# Patient Record
Sex: Male | Born: 1935 | Race: White | Hispanic: No | Marital: Married
Health system: Southern US, Community
[De-identification: ages and names within clinical notes are randomized; demographics above are authoritative.]

## PROBLEM LIST (undated history)

## (undated) DIAGNOSIS — I509 Heart failure, unspecified: Secondary | ICD-10-CM

## (undated) DIAGNOSIS — I10 Essential (primary) hypertension: Secondary | ICD-10-CM

## (undated) DIAGNOSIS — F0390 Unspecified dementia without behavioral disturbance: Secondary | ICD-10-CM

---

## 2000-07-08 ENCOUNTER — Inpatient Hospital Stay (HOSPITAL_COMMUNITY)
Admission: RE | Admit: 2000-07-08 | Discharge: 2000-07-17 | Payer: Self-pay | Admitting: Physical Medicine and Rehabilitation

## 2000-07-11 ENCOUNTER — Encounter: Payer: Self-pay | Admitting: Physical Medicine and Rehabilitation

## 2015-12-05 DIAGNOSIS — E8881 Metabolic syndrome: Secondary | ICD-10-CM | POA: Diagnosis not present

## 2015-12-05 DIAGNOSIS — I5032 Chronic diastolic (congestive) heart failure: Secondary | ICD-10-CM | POA: Diagnosis not present

## 2015-12-05 DIAGNOSIS — N183 Chronic kidney disease, stage 3 (moderate): Secondary | ICD-10-CM | POA: Diagnosis not present

## 2015-12-05 DIAGNOSIS — I110 Hypertensive heart disease with heart failure: Secondary | ICD-10-CM | POA: Diagnosis not present

## 2016-05-29 DIAGNOSIS — H2513 Age-related nuclear cataract, bilateral: Secondary | ICD-10-CM | POA: Diagnosis not present

## 2016-06-07 DIAGNOSIS — Z79899 Other long term (current) drug therapy: Secondary | ICD-10-CM | POA: Diagnosis not present

## 2016-06-07 DIAGNOSIS — D638 Anemia in other chronic diseases classified elsewhere: Secondary | ICD-10-CM | POA: Diagnosis not present

## 2016-06-07 DIAGNOSIS — N184 Chronic kidney disease, stage 4 (severe): Secondary | ICD-10-CM | POA: Diagnosis not present

## 2016-06-07 DIAGNOSIS — I5032 Chronic diastolic (congestive) heart failure: Secondary | ICD-10-CM | POA: Diagnosis not present

## 2016-06-07 DIAGNOSIS — I110 Hypertensive heart disease with heart failure: Secondary | ICD-10-CM | POA: Diagnosis not present

## 2016-07-09 DIAGNOSIS — H34832 Tributary (branch) retinal vein occlusion, left eye, with macular edema: Secondary | ICD-10-CM | POA: Diagnosis not present

## 2016-07-09 DIAGNOSIS — H40013 Open angle with borderline findings, low risk, bilateral: Secondary | ICD-10-CM | POA: Diagnosis not present

## 2016-07-09 DIAGNOSIS — H2511 Age-related nuclear cataract, right eye: Secondary | ICD-10-CM | POA: Diagnosis not present

## 2016-07-09 DIAGNOSIS — H348392 Tributary (branch) retinal vein occlusion, unspecified eye, stable: Secondary | ICD-10-CM | POA: Diagnosis not present

## 2016-07-10 DIAGNOSIS — N184 Chronic kidney disease, stage 4 (severe): Secondary | ICD-10-CM | POA: Diagnosis not present

## 2016-07-10 DIAGNOSIS — I160 Hypertensive urgency: Secondary | ICD-10-CM | POA: Diagnosis not present

## 2016-07-10 DIAGNOSIS — Z2821 Immunization not carried out because of patient refusal: Secondary | ICD-10-CM | POA: Diagnosis not present

## 2016-07-10 DIAGNOSIS — I110 Hypertensive heart disease with heart failure: Secondary | ICD-10-CM | POA: Diagnosis not present

## 2016-07-24 DIAGNOSIS — I110 Hypertensive heart disease with heart failure: Secondary | ICD-10-CM | POA: Diagnosis not present

## 2016-07-24 DIAGNOSIS — N401 Enlarged prostate with lower urinary tract symptoms: Secondary | ICD-10-CM | POA: Diagnosis not present

## 2016-07-24 DIAGNOSIS — N184 Chronic kidney disease, stage 4 (severe): Secondary | ICD-10-CM | POA: Diagnosis not present

## 2016-07-30 DIAGNOSIS — Z79899 Other long term (current) drug therapy: Secondary | ICD-10-CM | POA: Diagnosis not present

## 2016-07-30 DIAGNOSIS — G4733 Obstructive sleep apnea (adult) (pediatric): Secondary | ICD-10-CM | POA: Diagnosis not present

## 2016-07-30 DIAGNOSIS — I110 Hypertensive heart disease with heart failure: Secondary | ICD-10-CM | POA: Diagnosis not present

## 2016-07-30 DIAGNOSIS — H2511 Age-related nuclear cataract, right eye: Secondary | ICD-10-CM | POA: Diagnosis not present

## 2016-07-30 DIAGNOSIS — Z87891 Personal history of nicotine dependence: Secondary | ICD-10-CM | POA: Diagnosis not present

## 2016-07-30 DIAGNOSIS — Z7982 Long term (current) use of aspirin: Secondary | ICD-10-CM | POA: Diagnosis not present

## 2016-07-30 DIAGNOSIS — K219 Gastro-esophageal reflux disease without esophagitis: Secondary | ICD-10-CM | POA: Diagnosis not present

## 2016-07-30 DIAGNOSIS — I509 Heart failure, unspecified: Secondary | ICD-10-CM | POA: Diagnosis not present

## 2016-09-07 DIAGNOSIS — I10 Essential (primary) hypertension: Secondary | ICD-10-CM | POA: Diagnosis not present

## 2016-09-12 DIAGNOSIS — N184 Chronic kidney disease, stage 4 (severe): Secondary | ICD-10-CM | POA: Diagnosis not present

## 2016-09-12 DIAGNOSIS — I110 Hypertensive heart disease with heart failure: Secondary | ICD-10-CM | POA: Diagnosis not present

## 2017-02-25 DIAGNOSIS — H26491 Other secondary cataract, right eye: Secondary | ICD-10-CM | POA: Diagnosis not present

## 2017-02-25 DIAGNOSIS — H02836 Dermatochalasis of left eye, unspecified eyelid: Secondary | ICD-10-CM | POA: Diagnosis not present

## 2017-02-25 DIAGNOSIS — H02833 Dermatochalasis of right eye, unspecified eyelid: Secondary | ICD-10-CM | POA: Diagnosis not present

## 2017-02-25 DIAGNOSIS — H2512 Age-related nuclear cataract, left eye: Secondary | ICD-10-CM | POA: Diagnosis not present

## 2017-03-04 DIAGNOSIS — I110 Hypertensive heart disease with heart failure: Secondary | ICD-10-CM | POA: Diagnosis not present

## 2017-03-04 DIAGNOSIS — I5032 Chronic diastolic (congestive) heart failure: Secondary | ICD-10-CM | POA: Diagnosis not present

## 2017-03-04 DIAGNOSIS — I5030 Unspecified diastolic (congestive) heart failure: Secondary | ICD-10-CM | POA: Diagnosis not present

## 2017-03-04 DIAGNOSIS — N184 Chronic kidney disease, stage 4 (severe): Secondary | ICD-10-CM | POA: Diagnosis not present

## 2017-06-05 DIAGNOSIS — M10072 Idiopathic gout, left ankle and foot: Secondary | ICD-10-CM | POA: Diagnosis not present

## 2017-06-05 DIAGNOSIS — M109 Gout, unspecified: Secondary | ICD-10-CM | POA: Diagnosis not present

## 2017-06-06 DIAGNOSIS — I129 Hypertensive chronic kidney disease with stage 1 through stage 4 chronic kidney disease, or unspecified chronic kidney disease: Secondary | ICD-10-CM | POA: Diagnosis not present

## 2017-06-06 DIAGNOSIS — N189 Chronic kidney disease, unspecified: Secondary | ICD-10-CM | POA: Diagnosis not present

## 2017-07-03 DIAGNOSIS — E559 Vitamin D deficiency, unspecified: Secondary | ICD-10-CM | POA: Diagnosis not present

## 2017-07-03 DIAGNOSIS — Z1389 Encounter for screening for other disorder: Secondary | ICD-10-CM | POA: Diagnosis not present

## 2017-07-03 DIAGNOSIS — Z79899 Other long term (current) drug therapy: Secondary | ICD-10-CM | POA: Diagnosis not present

## 2017-07-03 DIAGNOSIS — Z0000 Encounter for general adult medical examination without abnormal findings: Secondary | ICD-10-CM | POA: Diagnosis not present

## 2017-07-03 DIAGNOSIS — Z9181 History of falling: Secondary | ICD-10-CM | POA: Diagnosis not present

## 2017-07-03 DIAGNOSIS — N184 Chronic kidney disease, stage 4 (severe): Secondary | ICD-10-CM | POA: Diagnosis not present

## 2018-02-12 DIAGNOSIS — E559 Vitamin D deficiency, unspecified: Secondary | ICD-10-CM | POA: Diagnosis not present

## 2018-02-12 DIAGNOSIS — I10 Essential (primary) hypertension: Secondary | ICD-10-CM | POA: Diagnosis not present

## 2018-02-12 DIAGNOSIS — Z79899 Other long term (current) drug therapy: Secondary | ICD-10-CM | POA: Diagnosis not present

## 2018-02-12 DIAGNOSIS — D638 Anemia in other chronic diseases classified elsewhere: Secondary | ICD-10-CM | POA: Diagnosis not present

## 2018-02-12 DIAGNOSIS — I5032 Chronic diastolic (congestive) heart failure: Secondary | ICD-10-CM | POA: Diagnosis not present

## 2018-03-13 DIAGNOSIS — I10 Essential (primary) hypertension: Secondary | ICD-10-CM | POA: Diagnosis not present

## 2018-03-13 DIAGNOSIS — I5032 Chronic diastolic (congestive) heart failure: Secondary | ICD-10-CM | POA: Diagnosis not present

## 2018-03-13 DIAGNOSIS — N184 Chronic kidney disease, stage 4 (severe): Secondary | ICD-10-CM | POA: Diagnosis not present

## 2018-04-05 DIAGNOSIS — R609 Edema, unspecified: Secondary | ICD-10-CM | POA: Diagnosis not present

## 2018-04-05 DIAGNOSIS — R0602 Shortness of breath: Secondary | ICD-10-CM | POA: Diagnosis not present

## 2018-04-05 DIAGNOSIS — I4510 Unspecified right bundle-branch block: Secondary | ICD-10-CM | POA: Diagnosis not present

## 2018-04-05 DIAGNOSIS — I440 Atrioventricular block, first degree: Secondary | ICD-10-CM | POA: Diagnosis not present

## 2018-04-06 DIAGNOSIS — I509 Heart failure, unspecified: Secondary | ICD-10-CM | POA: Diagnosis not present

## 2018-04-06 DIAGNOSIS — Z66 Do not resuscitate: Secondary | ICD-10-CM | POA: Diagnosis not present

## 2018-04-06 DIAGNOSIS — I10 Essential (primary) hypertension: Secondary | ICD-10-CM | POA: Diagnosis not present

## 2018-04-06 DIAGNOSIS — Z87891 Personal history of nicotine dependence: Secondary | ICD-10-CM | POA: Diagnosis not present

## 2018-04-06 DIAGNOSIS — I2510 Atherosclerotic heart disease of native coronary artery without angina pectoris: Secondary | ICD-10-CM | POA: Diagnosis not present

## 2018-04-06 DIAGNOSIS — N202 Calculus of kidney with calculus of ureter: Secondary | ICD-10-CM | POA: Diagnosis not present

## 2018-04-06 DIAGNOSIS — I5033 Acute on chronic diastolic (congestive) heart failure: Secondary | ICD-10-CM | POA: Diagnosis not present

## 2018-04-06 DIAGNOSIS — J9601 Acute respiratory failure with hypoxia: Secondary | ICD-10-CM | POA: Diagnosis not present

## 2018-04-06 DIAGNOSIS — I130 Hypertensive heart and chronic kidney disease with heart failure and stage 1 through stage 4 chronic kidney disease, or unspecified chronic kidney disease: Secondary | ICD-10-CM | POA: Diagnosis not present

## 2018-04-06 DIAGNOSIS — R001 Bradycardia, unspecified: Secondary | ICD-10-CM | POA: Diagnosis not present

## 2018-04-06 DIAGNOSIS — N184 Chronic kidney disease, stage 4 (severe): Secondary | ICD-10-CM | POA: Diagnosis not present

## 2018-04-06 DIAGNOSIS — E875 Hyperkalemia: Secondary | ICD-10-CM | POA: Diagnosis not present

## 2018-04-06 DIAGNOSIS — I110 Hypertensive heart disease with heart failure: Secondary | ICD-10-CM | POA: Diagnosis not present

## 2018-04-06 DIAGNOSIS — R0602 Shortness of breath: Secondary | ICD-10-CM

## 2018-04-06 DIAGNOSIS — I340 Nonrheumatic mitral (valve) insufficiency: Secondary | ICD-10-CM | POA: Diagnosis not present

## 2018-04-06 DIAGNOSIS — I351 Nonrheumatic aortic (valve) insufficiency: Secondary | ICD-10-CM | POA: Diagnosis not present

## 2018-04-06 DIAGNOSIS — Z2821 Immunization not carried out because of patient refusal: Secondary | ICD-10-CM | POA: Diagnosis not present

## 2018-04-06 DIAGNOSIS — N139 Obstructive and reflux uropathy, unspecified: Secondary | ICD-10-CM | POA: Diagnosis not present

## 2018-04-06 DIAGNOSIS — N189 Chronic kidney disease, unspecified: Secondary | ICD-10-CM | POA: Diagnosis not present

## 2018-04-06 DIAGNOSIS — R079 Chest pain, unspecified: Secondary | ICD-10-CM | POA: Diagnosis not present

## 2018-04-06 DIAGNOSIS — N179 Acute kidney failure, unspecified: Secondary | ICD-10-CM | POA: Diagnosis not present

## 2018-04-06 DIAGNOSIS — N1330 Unspecified hydronephrosis: Secondary | ICD-10-CM | POA: Diagnosis not present

## 2018-04-06 DIAGNOSIS — I161 Hypertensive emergency: Secondary | ICD-10-CM | POA: Diagnosis not present

## 2018-04-06 DIAGNOSIS — I441 Atrioventricular block, second degree: Secondary | ICD-10-CM | POA: Diagnosis not present

## 2018-04-06 DIAGNOSIS — N131 Hydronephrosis with ureteral stricture, not elsewhere classified: Secondary | ICD-10-CM | POA: Diagnosis not present

## 2018-04-07 DIAGNOSIS — N1330 Unspecified hydronephrosis: Secondary | ICD-10-CM | POA: Diagnosis not present

## 2018-04-09 DIAGNOSIS — R0602 Shortness of breath: Secondary | ICD-10-CM

## 2018-04-14 DIAGNOSIS — I441 Atrioventricular block, second degree: Secondary | ICD-10-CM | POA: Diagnosis not present

## 2018-04-14 DIAGNOSIS — I509 Heart failure, unspecified: Secondary | ICD-10-CM | POA: Diagnosis not present

## 2018-04-14 DIAGNOSIS — N139 Obstructive and reflux uropathy, unspecified: Secondary | ICD-10-CM | POA: Diagnosis not present

## 2018-04-14 DIAGNOSIS — I130 Hypertensive heart and chronic kidney disease with heart failure and stage 1 through stage 4 chronic kidney disease, or unspecified chronic kidney disease: Secondary | ICD-10-CM | POA: Diagnosis not present

## 2018-04-14 DIAGNOSIS — Z7902 Long term (current) use of antithrombotics/antiplatelets: Secondary | ICD-10-CM | POA: Diagnosis not present

## 2018-04-14 DIAGNOSIS — N184 Chronic kidney disease, stage 4 (severe): Secondary | ICD-10-CM | POA: Diagnosis not present

## 2018-04-14 DIAGNOSIS — Z436 Encounter for attention to other artificial openings of urinary tract: Secondary | ICD-10-CM | POA: Diagnosis not present

## 2018-04-14 DIAGNOSIS — M1991 Primary osteoarthritis, unspecified site: Secondary | ICD-10-CM | POA: Diagnosis not present

## 2018-04-14 DIAGNOSIS — Z7982 Long term (current) use of aspirin: Secondary | ICD-10-CM | POA: Diagnosis not present

## 2018-04-14 DIAGNOSIS — N400 Enlarged prostate without lower urinary tract symptoms: Secondary | ICD-10-CM | POA: Diagnosis not present

## 2018-04-14 DIAGNOSIS — N1330 Unspecified hydronephrosis: Secondary | ICD-10-CM | POA: Diagnosis not present

## 2018-04-16 DIAGNOSIS — N1330 Unspecified hydronephrosis: Secondary | ICD-10-CM | POA: Diagnosis not present

## 2018-04-16 DIAGNOSIS — N135 Crossing vessel and stricture of ureter without hydronephrosis: Secondary | ICD-10-CM | POA: Diagnosis not present

## 2018-04-17 DIAGNOSIS — Z7982 Long term (current) use of aspirin: Secondary | ICD-10-CM | POA: Diagnosis not present

## 2018-04-17 DIAGNOSIS — Z7902 Long term (current) use of antithrombotics/antiplatelets: Secondary | ICD-10-CM | POA: Diagnosis not present

## 2018-04-17 DIAGNOSIS — Z436 Encounter for attention to other artificial openings of urinary tract: Secondary | ICD-10-CM | POA: Diagnosis not present

## 2018-04-17 DIAGNOSIS — N1330 Unspecified hydronephrosis: Secondary | ICD-10-CM | POA: Diagnosis not present

## 2018-04-17 DIAGNOSIS — I509 Heart failure, unspecified: Secondary | ICD-10-CM | POA: Diagnosis not present

## 2018-04-17 DIAGNOSIS — I130 Hypertensive heart and chronic kidney disease with heart failure and stage 1 through stage 4 chronic kidney disease, or unspecified chronic kidney disease: Secondary | ICD-10-CM | POA: Diagnosis not present

## 2018-04-17 DIAGNOSIS — N184 Chronic kidney disease, stage 4 (severe): Secondary | ICD-10-CM | POA: Diagnosis not present

## 2018-04-17 DIAGNOSIS — N139 Obstructive and reflux uropathy, unspecified: Secondary | ICD-10-CM | POA: Diagnosis not present

## 2018-04-17 DIAGNOSIS — M1991 Primary osteoarthritis, unspecified site: Secondary | ICD-10-CM | POA: Diagnosis not present

## 2018-04-17 DIAGNOSIS — N400 Enlarged prostate without lower urinary tract symptoms: Secondary | ICD-10-CM | POA: Diagnosis not present

## 2018-04-17 DIAGNOSIS — I441 Atrioventricular block, second degree: Secondary | ICD-10-CM | POA: Diagnosis not present

## 2018-04-20 DIAGNOSIS — N1330 Unspecified hydronephrosis: Secondary | ICD-10-CM | POA: Diagnosis not present

## 2018-04-21 DIAGNOSIS — I10 Essential (primary) hypertension: Secondary | ICD-10-CM | POA: Diagnosis not present

## 2018-04-21 DIAGNOSIS — J449 Chronic obstructive pulmonary disease, unspecified: Secondary | ICD-10-CM | POA: Diagnosis not present

## 2018-04-21 DIAGNOSIS — R279 Unspecified lack of coordination: Secondary | ICD-10-CM | POA: Diagnosis not present

## 2018-04-21 DIAGNOSIS — Z7401 Bed confinement status: Secondary | ICD-10-CM | POA: Diagnosis not present

## 2018-04-21 DIAGNOSIS — Q6211 Congenital occlusion of ureteropelvic junction: Secondary | ICD-10-CM | POA: Diagnosis not present

## 2018-04-21 DIAGNOSIS — Z87891 Personal history of nicotine dependence: Secondary | ICD-10-CM | POA: Diagnosis not present

## 2018-04-21 DIAGNOSIS — N400 Enlarged prostate without lower urinary tract symptoms: Secondary | ICD-10-CM | POA: Diagnosis not present

## 2018-04-21 DIAGNOSIS — N131 Hydronephrosis with ureteral stricture, not elsewhere classified: Secondary | ICD-10-CM | POA: Diagnosis not present

## 2018-04-21 DIAGNOSIS — I272 Pulmonary hypertension, unspecified: Secondary | ICD-10-CM | POA: Diagnosis not present

## 2018-04-21 DIAGNOSIS — T83022A Displacement of nephrostomy catheter, initial encounter: Secondary | ICD-10-CM | POA: Diagnosis not present

## 2018-04-21 DIAGNOSIS — G4733 Obstructive sleep apnea (adult) (pediatric): Secondary | ICD-10-CM | POA: Diagnosis not present

## 2018-04-21 DIAGNOSIS — Z7982 Long term (current) use of aspirin: Secondary | ICD-10-CM | POA: Diagnosis not present

## 2018-04-21 DIAGNOSIS — Z79899 Other long term (current) drug therapy: Secondary | ICD-10-CM | POA: Diagnosis not present

## 2018-04-21 DIAGNOSIS — N184 Chronic kidney disease, stage 4 (severe): Secondary | ICD-10-CM | POA: Diagnosis not present

## 2018-04-21 DIAGNOSIS — J9601 Acute respiratory failure with hypoxia: Secondary | ICD-10-CM | POA: Diagnosis not present

## 2018-04-21 DIAGNOSIS — R0902 Hypoxemia: Secondary | ICD-10-CM | POA: Diagnosis not present

## 2018-04-21 DIAGNOSIS — N1330 Unspecified hydronephrosis: Secondary | ICD-10-CM | POA: Diagnosis not present

## 2018-04-21 DIAGNOSIS — M1990 Unspecified osteoarthritis, unspecified site: Secondary | ICD-10-CM | POA: Diagnosis not present

## 2018-04-21 DIAGNOSIS — I5033 Acute on chronic diastolic (congestive) heart failure: Secondary | ICD-10-CM | POA: Diagnosis not present

## 2018-04-21 DIAGNOSIS — I130 Hypertensive heart and chronic kidney disease with heart failure and stage 1 through stage 4 chronic kidney disease, or unspecified chronic kidney disease: Secondary | ICD-10-CM | POA: Diagnosis not present

## 2018-04-25 DIAGNOSIS — J9601 Acute respiratory failure with hypoxia: Secondary | ICD-10-CM | POA: Diagnosis not present

## 2018-04-25 DIAGNOSIS — N3090 Cystitis, unspecified without hematuria: Secondary | ICD-10-CM | POA: Diagnosis not present

## 2018-04-25 DIAGNOSIS — N318 Other neuromuscular dysfunction of bladder: Secondary | ICD-10-CM | POA: Diagnosis not present

## 2018-04-25 DIAGNOSIS — Z7401 Bed confinement status: Secondary | ICD-10-CM | POA: Diagnosis not present

## 2018-04-25 DIAGNOSIS — R279 Unspecified lack of coordination: Secondary | ICD-10-CM | POA: Diagnosis not present

## 2018-04-25 DIAGNOSIS — I5033 Acute on chronic diastolic (congestive) heart failure: Secondary | ICD-10-CM | POA: Diagnosis not present

## 2018-04-25 DIAGNOSIS — R262 Difficulty in walking, not elsewhere classified: Secondary | ICD-10-CM | POA: Diagnosis not present

## 2018-04-25 DIAGNOSIS — E559 Vitamin D deficiency, unspecified: Secondary | ICD-10-CM | POA: Diagnosis not present

## 2018-04-25 DIAGNOSIS — D649 Anemia, unspecified: Secondary | ICD-10-CM | POA: Diagnosis not present

## 2018-04-25 DIAGNOSIS — N132 Hydronephrosis with renal and ureteral calculous obstruction: Secondary | ICD-10-CM | POA: Diagnosis not present

## 2018-04-25 DIAGNOSIS — N131 Hydronephrosis with ureteral stricture, not elsewhere classified: Secondary | ICD-10-CM | POA: Diagnosis not present

## 2018-04-25 DIAGNOSIS — J9690 Respiratory failure, unspecified, unspecified whether with hypoxia or hypercapnia: Secondary | ICD-10-CM | POA: Diagnosis not present

## 2018-04-25 DIAGNOSIS — N179 Acute kidney failure, unspecified: Secondary | ICD-10-CM | POA: Diagnosis not present

## 2018-04-25 DIAGNOSIS — N139 Obstructive and reflux uropathy, unspecified: Secondary | ICD-10-CM | POA: Diagnosis not present

## 2018-04-25 DIAGNOSIS — N1330 Unspecified hydronephrosis: Secondary | ICD-10-CM | POA: Diagnosis not present

## 2018-04-25 DIAGNOSIS — I10 Essential (primary) hypertension: Secondary | ICD-10-CM | POA: Diagnosis not present

## 2018-04-25 DIAGNOSIS — N184 Chronic kidney disease, stage 4 (severe): Secondary | ICD-10-CM | POA: Diagnosis not present

## 2018-04-25 DIAGNOSIS — J449 Chronic obstructive pulmonary disease, unspecified: Secondary | ICD-10-CM | POA: Diagnosis not present

## 2018-04-25 DIAGNOSIS — N3020 Other chronic cystitis without hematuria: Secondary | ICD-10-CM | POA: Diagnosis not present

## 2018-04-25 DIAGNOSIS — N189 Chronic kidney disease, unspecified: Secondary | ICD-10-CM | POA: Diagnosis not present

## 2018-04-25 DIAGNOSIS — N135 Crossing vessel and stricture of ureter without hydronephrosis: Secondary | ICD-10-CM | POA: Diagnosis not present

## 2018-04-25 DIAGNOSIS — Z466 Encounter for fitting and adjustment of urinary device: Secondary | ICD-10-CM | POA: Diagnosis not present

## 2018-04-27 DIAGNOSIS — R262 Difficulty in walking, not elsewhere classified: Secondary | ICD-10-CM | POA: Diagnosis not present

## 2018-04-30 DIAGNOSIS — N135 Crossing vessel and stricture of ureter without hydronephrosis: Secondary | ICD-10-CM | POA: Diagnosis not present

## 2018-04-30 DIAGNOSIS — Z466 Encounter for fitting and adjustment of urinary device: Secondary | ICD-10-CM | POA: Diagnosis not present

## 2018-04-30 DIAGNOSIS — N132 Hydronephrosis with renal and ureteral calculous obstruction: Secondary | ICD-10-CM | POA: Diagnosis not present

## 2018-05-08 DIAGNOSIS — N3020 Other chronic cystitis without hematuria: Secondary | ICD-10-CM | POA: Diagnosis not present

## 2018-05-08 DIAGNOSIS — N1330 Unspecified hydronephrosis: Secondary | ICD-10-CM | POA: Diagnosis not present

## 2018-05-08 DIAGNOSIS — N318 Other neuromuscular dysfunction of bladder: Secondary | ICD-10-CM | POA: Diagnosis not present

## 2018-05-11 DIAGNOSIS — N179 Acute kidney failure, unspecified: Secondary | ICD-10-CM | POA: Diagnosis not present

## 2018-05-11 DIAGNOSIS — D649 Anemia, unspecified: Secondary | ICD-10-CM | POA: Diagnosis not present

## 2018-05-11 DIAGNOSIS — N139 Obstructive and reflux uropathy, unspecified: Secondary | ICD-10-CM | POA: Diagnosis not present

## 2018-05-11 DIAGNOSIS — N189 Chronic kidney disease, unspecified: Secondary | ICD-10-CM | POA: Diagnosis not present

## 2018-05-11 DIAGNOSIS — J9690 Respiratory failure, unspecified, unspecified whether with hypoxia or hypercapnia: Secondary | ICD-10-CM | POA: Diagnosis not present

## 2018-05-21 DIAGNOSIS — J449 Chronic obstructive pulmonary disease, unspecified: Secondary | ICD-10-CM | POA: Diagnosis not present

## 2018-05-21 DIAGNOSIS — I5033 Acute on chronic diastolic (congestive) heart failure: Secondary | ICD-10-CM | POA: Diagnosis not present

## 2018-05-21 DIAGNOSIS — E559 Vitamin D deficiency, unspecified: Secondary | ICD-10-CM | POA: Diagnosis not present

## 2018-05-21 DIAGNOSIS — N131 Hydronephrosis with ureteral stricture, not elsewhere classified: Secondary | ICD-10-CM | POA: Diagnosis not present

## 2018-05-22 DIAGNOSIS — N131 Hydronephrosis with ureteral stricture, not elsewhere classified: Secondary | ICD-10-CM | POA: Diagnosis not present

## 2018-05-22 DIAGNOSIS — I5033 Acute on chronic diastolic (congestive) heart failure: Secondary | ICD-10-CM | POA: Diagnosis not present

## 2018-05-22 DIAGNOSIS — I10 Essential (primary) hypertension: Secondary | ICD-10-CM | POA: Diagnosis not present

## 2018-05-22 DIAGNOSIS — J449 Chronic obstructive pulmonary disease, unspecified: Secondary | ICD-10-CM | POA: Diagnosis not present

## 2018-05-23 DIAGNOSIS — E559 Vitamin D deficiency, unspecified: Secondary | ICD-10-CM | POA: Diagnosis not present

## 2018-05-23 DIAGNOSIS — D649 Anemia, unspecified: Secondary | ICD-10-CM | POA: Diagnosis not present

## 2018-05-23 DIAGNOSIS — Z79899 Other long term (current) drug therapy: Secondary | ICD-10-CM | POA: Diagnosis not present

## 2018-05-23 DIAGNOSIS — E039 Hypothyroidism, unspecified: Secondary | ICD-10-CM | POA: Diagnosis not present

## 2018-05-23 DIAGNOSIS — E785 Hyperlipidemia, unspecified: Secondary | ICD-10-CM | POA: Diagnosis not present

## 2018-05-23 DIAGNOSIS — E119 Type 2 diabetes mellitus without complications: Secondary | ICD-10-CM | POA: Diagnosis not present

## 2018-05-29 DIAGNOSIS — N390 Urinary tract infection, site not specified: Secondary | ICD-10-CM | POA: Diagnosis not present

## 2018-05-29 DIAGNOSIS — R319 Hematuria, unspecified: Secondary | ICD-10-CM | POA: Diagnosis not present

## 2018-05-30 DIAGNOSIS — D649 Anemia, unspecified: Secondary | ICD-10-CM | POA: Diagnosis not present

## 2018-05-30 DIAGNOSIS — R319 Hematuria, unspecified: Secondary | ICD-10-CM | POA: Diagnosis not present

## 2018-05-30 DIAGNOSIS — Z79899 Other long term (current) drug therapy: Secondary | ICD-10-CM | POA: Diagnosis not present

## 2018-05-30 DIAGNOSIS — R790 Abnormal level of blood mineral: Secondary | ICD-10-CM | POA: Diagnosis not present

## 2018-06-01 DIAGNOSIS — R790 Abnormal level of blood mineral: Secondary | ICD-10-CM | POA: Diagnosis not present

## 2018-06-01 DIAGNOSIS — D649 Anemia, unspecified: Secondary | ICD-10-CM | POA: Diagnosis not present

## 2018-06-03 DIAGNOSIS — I10 Essential (primary) hypertension: Secondary | ICD-10-CM | POA: Diagnosis not present

## 2018-06-03 DIAGNOSIS — K5900 Constipation, unspecified: Secondary | ICD-10-CM | POA: Diagnosis not present

## 2018-06-03 DIAGNOSIS — G4700 Insomnia, unspecified: Secondary | ICD-10-CM | POA: Diagnosis not present

## 2018-06-03 DIAGNOSIS — I5033 Acute on chronic diastolic (congestive) heart failure: Secondary | ICD-10-CM | POA: Diagnosis not present

## 2018-06-05 DIAGNOSIS — R319 Hematuria, unspecified: Secondary | ICD-10-CM | POA: Diagnosis not present

## 2018-06-08 DIAGNOSIS — R278 Other lack of coordination: Secondary | ICD-10-CM | POA: Diagnosis not present

## 2018-06-08 DIAGNOSIS — M6281 Muscle weakness (generalized): Secondary | ICD-10-CM | POA: Diagnosis not present

## 2018-06-09 DIAGNOSIS — M6281 Muscle weakness (generalized): Secondary | ICD-10-CM | POA: Diagnosis not present

## 2018-06-09 DIAGNOSIS — J9690 Respiratory failure, unspecified, unspecified whether with hypoxia or hypercapnia: Secondary | ICD-10-CM | POA: Diagnosis not present

## 2018-06-09 DIAGNOSIS — N189 Chronic kidney disease, unspecified: Secondary | ICD-10-CM | POA: Diagnosis not present

## 2018-06-09 DIAGNOSIS — R278 Other lack of coordination: Secondary | ICD-10-CM | POA: Diagnosis not present

## 2018-06-09 DIAGNOSIS — N139 Obstructive and reflux uropathy, unspecified: Secondary | ICD-10-CM | POA: Diagnosis not present

## 2018-06-09 DIAGNOSIS — D649 Anemia, unspecified: Secondary | ICD-10-CM | POA: Diagnosis not present

## 2018-06-09 DIAGNOSIS — N179 Acute kidney failure, unspecified: Secondary | ICD-10-CM | POA: Diagnosis not present

## 2018-06-10 DIAGNOSIS — M6281 Muscle weakness (generalized): Secondary | ICD-10-CM | POA: Diagnosis not present

## 2018-06-10 DIAGNOSIS — R278 Other lack of coordination: Secondary | ICD-10-CM | POA: Diagnosis not present

## 2018-06-11 DIAGNOSIS — M6281 Muscle weakness (generalized): Secondary | ICD-10-CM | POA: Diagnosis not present

## 2018-06-11 DIAGNOSIS — R278 Other lack of coordination: Secondary | ICD-10-CM | POA: Diagnosis not present

## 2018-06-12 DIAGNOSIS — N184 Chronic kidney disease, stage 4 (severe): Secondary | ICD-10-CM | POA: Diagnosis not present

## 2018-06-12 DIAGNOSIS — R278 Other lack of coordination: Secondary | ICD-10-CM | POA: Diagnosis not present

## 2018-06-12 DIAGNOSIS — M6281 Muscle weakness (generalized): Secondary | ICD-10-CM | POA: Diagnosis not present

## 2018-06-12 DIAGNOSIS — I5032 Chronic diastolic (congestive) heart failure: Secondary | ICD-10-CM | POA: Diagnosis not present

## 2018-06-12 DIAGNOSIS — J449 Chronic obstructive pulmonary disease, unspecified: Secondary | ICD-10-CM | POA: Diagnosis not present

## 2018-06-12 DIAGNOSIS — N1330 Unspecified hydronephrosis: Secondary | ICD-10-CM | POA: Diagnosis not present

## 2018-06-15 DIAGNOSIS — D649 Anemia, unspecified: Secondary | ICD-10-CM | POA: Diagnosis not present

## 2018-06-15 DIAGNOSIS — I10 Essential (primary) hypertension: Secondary | ICD-10-CM | POA: Diagnosis not present

## 2018-06-15 DIAGNOSIS — R278 Other lack of coordination: Secondary | ICD-10-CM | POA: Diagnosis not present

## 2018-06-15 DIAGNOSIS — R6889 Other general symptoms and signs: Secondary | ICD-10-CM | POA: Diagnosis not present

## 2018-06-15 DIAGNOSIS — M6281 Muscle weakness (generalized): Secondary | ICD-10-CM | POA: Diagnosis not present

## 2018-06-16 DIAGNOSIS — M6281 Muscle weakness (generalized): Secondary | ICD-10-CM | POA: Diagnosis not present

## 2018-06-16 DIAGNOSIS — R278 Other lack of coordination: Secondary | ICD-10-CM | POA: Diagnosis not present

## 2018-06-17 DIAGNOSIS — D649 Anemia, unspecified: Secondary | ICD-10-CM | POA: Diagnosis not present

## 2018-06-17 DIAGNOSIS — G4700 Insomnia, unspecified: Secondary | ICD-10-CM | POA: Diagnosis not present

## 2018-06-17 DIAGNOSIS — I10 Essential (primary) hypertension: Secondary | ICD-10-CM | POA: Diagnosis not present

## 2018-06-17 DIAGNOSIS — G2581 Restless legs syndrome: Secondary | ICD-10-CM | POA: Diagnosis not present

## 2018-06-17 DIAGNOSIS — K5900 Constipation, unspecified: Secondary | ICD-10-CM | POA: Diagnosis not present

## 2018-06-17 DIAGNOSIS — R319 Hematuria, unspecified: Secondary | ICD-10-CM | POA: Diagnosis not present

## 2018-06-17 DIAGNOSIS — M6281 Muscle weakness (generalized): Secondary | ICD-10-CM | POA: Diagnosis not present

## 2018-06-17 DIAGNOSIS — R790 Abnormal level of blood mineral: Secondary | ICD-10-CM | POA: Diagnosis not present

## 2018-06-17 DIAGNOSIS — R278 Other lack of coordination: Secondary | ICD-10-CM | POA: Diagnosis not present

## 2018-06-17 DIAGNOSIS — I5032 Chronic diastolic (congestive) heart failure: Secondary | ICD-10-CM | POA: Diagnosis not present

## 2018-06-18 DIAGNOSIS — Z79899 Other long term (current) drug therapy: Secondary | ICD-10-CM | POA: Diagnosis not present

## 2018-06-18 DIAGNOSIS — M6281 Muscle weakness (generalized): Secondary | ICD-10-CM | POA: Diagnosis not present

## 2018-06-18 DIAGNOSIS — R278 Other lack of coordination: Secondary | ICD-10-CM | POA: Diagnosis not present

## 2018-06-18 DIAGNOSIS — D649 Anemia, unspecified: Secondary | ICD-10-CM | POA: Diagnosis not present

## 2018-06-19 DIAGNOSIS — R278 Other lack of coordination: Secondary | ICD-10-CM | POA: Diagnosis not present

## 2018-06-19 DIAGNOSIS — M6281 Muscle weakness (generalized): Secondary | ICD-10-CM | POA: Diagnosis not present

## 2018-06-20 DIAGNOSIS — M6281 Muscle weakness (generalized): Secondary | ICD-10-CM | POA: Diagnosis not present

## 2018-06-20 DIAGNOSIS — R278 Other lack of coordination: Secondary | ICD-10-CM | POA: Diagnosis not present

## 2018-06-22 DIAGNOSIS — M6281 Muscle weakness (generalized): Secondary | ICD-10-CM | POA: Diagnosis not present

## 2018-06-22 DIAGNOSIS — R278 Other lack of coordination: Secondary | ICD-10-CM | POA: Diagnosis not present

## 2018-06-23 DIAGNOSIS — M6281 Muscle weakness (generalized): Secondary | ICD-10-CM | POA: Diagnosis not present

## 2018-06-23 DIAGNOSIS — R278 Other lack of coordination: Secondary | ICD-10-CM | POA: Diagnosis not present

## 2018-06-24 DIAGNOSIS — R100 Acute abdomen: Secondary | ICD-10-CM | POA: Diagnosis not present

## 2018-06-24 DIAGNOSIS — R319 Hematuria, unspecified: Secondary | ICD-10-CM | POA: Diagnosis not present

## 2018-06-24 DIAGNOSIS — R278 Other lack of coordination: Secondary | ICD-10-CM | POA: Diagnosis not present

## 2018-06-24 DIAGNOSIS — M6281 Muscle weakness (generalized): Secondary | ICD-10-CM | POA: Diagnosis not present

## 2018-06-24 DIAGNOSIS — N200 Calculus of kidney: Secondary | ICD-10-CM | POA: Diagnosis not present

## 2018-06-24 DIAGNOSIS — G2581 Restless legs syndrome: Secondary | ICD-10-CM | POA: Diagnosis not present

## 2018-06-25 DIAGNOSIS — N200 Calculus of kidney: Secondary | ICD-10-CM | POA: Diagnosis not present

## 2018-06-25 DIAGNOSIS — N131 Hydronephrosis with ureteral stricture, not elsewhere classified: Secondary | ICD-10-CM | POA: Diagnosis not present

## 2018-06-25 DIAGNOSIS — M6281 Muscle weakness (generalized): Secondary | ICD-10-CM | POA: Diagnosis not present

## 2018-06-25 DIAGNOSIS — R319 Hematuria, unspecified: Secondary | ICD-10-CM | POA: Diagnosis not present

## 2018-06-25 DIAGNOSIS — Z79899 Other long term (current) drug therapy: Secondary | ICD-10-CM | POA: Diagnosis not present

## 2018-06-25 DIAGNOSIS — R278 Other lack of coordination: Secondary | ICD-10-CM | POA: Diagnosis not present

## 2018-06-25 DIAGNOSIS — N3090 Cystitis, unspecified without hematuria: Secondary | ICD-10-CM | POA: Diagnosis not present

## 2018-06-25 DIAGNOSIS — N184 Chronic kidney disease, stage 4 (severe): Secondary | ICD-10-CM | POA: Diagnosis not present

## 2018-06-25 DIAGNOSIS — N390 Urinary tract infection, site not specified: Secondary | ICD-10-CM | POA: Diagnosis not present

## 2018-06-25 DIAGNOSIS — N3020 Other chronic cystitis without hematuria: Secondary | ICD-10-CM | POA: Diagnosis not present

## 2018-06-29 DIAGNOSIS — I10 Essential (primary) hypertension: Secondary | ICD-10-CM | POA: Diagnosis not present

## 2018-06-29 DIAGNOSIS — G2581 Restless legs syndrome: Secondary | ICD-10-CM | POA: Diagnosis not present

## 2018-06-29 DIAGNOSIS — K5900 Constipation, unspecified: Secondary | ICD-10-CM | POA: Diagnosis not present

## 2018-06-29 DIAGNOSIS — N390 Urinary tract infection, site not specified: Secondary | ICD-10-CM | POA: Diagnosis not present

## 2018-07-01 ENCOUNTER — Inpatient Hospital Stay (HOSPITAL_COMMUNITY): Payer: Medicare Other

## 2018-07-01 ENCOUNTER — Inpatient Hospital Stay (HOSPITAL_COMMUNITY)
Admission: AD | Admit: 2018-07-01 | Discharge: 2018-07-02 | DRG: 377 | Disposition: A | Discharge status: Hospice | Payer: Medicare Other | Source: Other Acute Inpatient Hospital | Attending: Internal Medicine | Admitting: Internal Medicine

## 2018-07-01 ENCOUNTER — Encounter (HOSPITAL_COMMUNITY): Payer: Self-pay | Admitting: Family Medicine

## 2018-07-01 DIAGNOSIS — R4182 Altered mental status, unspecified: Secondary | ICD-10-CM | POA: Diagnosis present

## 2018-07-01 DIAGNOSIS — N136 Pyonephrosis: Secondary | ICD-10-CM | POA: Diagnosis present

## 2018-07-01 DIAGNOSIS — D62 Acute posthemorrhagic anemia: Secondary | ICD-10-CM | POA: Diagnosis not present

## 2018-07-01 DIAGNOSIS — J449 Chronic obstructive pulmonary disease, unspecified: Secondary | ICD-10-CM | POA: Diagnosis not present

## 2018-07-01 DIAGNOSIS — I509 Heart failure, unspecified: Secondary | ICD-10-CM

## 2018-07-01 DIAGNOSIS — I130 Hypertensive heart and chronic kidney disease with heart failure and stage 1 through stage 4 chronic kidney disease, or unspecified chronic kidney disease: Secondary | ICD-10-CM | POA: Diagnosis present

## 2018-07-01 DIAGNOSIS — J189 Pneumonia, unspecified organism: Secondary | ICD-10-CM | POA: Diagnosis present

## 2018-07-01 DIAGNOSIS — Z515 Encounter for palliative care: Secondary | ICD-10-CM | POA: Diagnosis present

## 2018-07-01 DIAGNOSIS — G9341 Metabolic encephalopathy: Secondary | ICD-10-CM | POA: Diagnosis not present

## 2018-07-01 DIAGNOSIS — N1330 Unspecified hydronephrosis: Secondary | ICD-10-CM | POA: Diagnosis not present

## 2018-07-01 DIAGNOSIS — N390 Urinary tract infection, site not specified: Secondary | ICD-10-CM | POA: Diagnosis present

## 2018-07-01 DIAGNOSIS — I10 Essential (primary) hypertension: Secondary | ICD-10-CM | POA: Diagnosis present

## 2018-07-01 DIAGNOSIS — K922 Gastrointestinal hemorrhage, unspecified: Secondary | ICD-10-CM | POA: Diagnosis not present

## 2018-07-01 DIAGNOSIS — G9340 Encephalopathy, unspecified: Secondary | ICD-10-CM | POA: Diagnosis not present

## 2018-07-01 DIAGNOSIS — Z66 Do not resuscitate: Secondary | ICD-10-CM | POA: Diagnosis not present

## 2018-07-01 DIAGNOSIS — R5381 Other malaise: Secondary | ICD-10-CM | POA: Diagnosis not present

## 2018-07-01 DIAGNOSIS — Z87891 Personal history of nicotine dependence: Secondary | ICD-10-CM | POA: Diagnosis not present

## 2018-07-01 DIAGNOSIS — N135 Crossing vessel and stricture of ureter without hydronephrosis: Secondary | ICD-10-CM | POA: Diagnosis present

## 2018-07-01 DIAGNOSIS — N184 Chronic kidney disease, stage 4 (severe): Secondary | ICD-10-CM | POA: Diagnosis present

## 2018-07-01 DIAGNOSIS — E162 Hypoglycemia, unspecified: Secondary | ICD-10-CM | POA: Diagnosis present

## 2018-07-01 DIAGNOSIS — R0603 Acute respiratory distress: Secondary | ICD-10-CM

## 2018-07-01 DIAGNOSIS — B952 Enterococcus as the cause of diseases classified elsewhere: Secondary | ICD-10-CM | POA: Diagnosis present

## 2018-07-01 DIAGNOSIS — Z7401 Bed confinement status: Secondary | ICD-10-CM | POA: Diagnosis not present

## 2018-07-01 DIAGNOSIS — R404 Transient alteration of awareness: Secondary | ICD-10-CM | POA: Diagnosis not present

## 2018-07-01 DIAGNOSIS — N179 Acute kidney failure, unspecified: Secondary | ICD-10-CM | POA: Diagnosis not present

## 2018-07-01 DIAGNOSIS — J9811 Atelectasis: Secondary | ICD-10-CM | POA: Diagnosis not present

## 2018-07-01 DIAGNOSIS — F0390 Unspecified dementia without behavioral disturbance: Secondary | ICD-10-CM | POA: Diagnosis present

## 2018-07-01 DIAGNOSIS — G2581 Restless legs syndrome: Secondary | ICD-10-CM | POA: Diagnosis not present

## 2018-07-01 DIAGNOSIS — E875 Hyperkalemia: Secondary | ICD-10-CM | POA: Diagnosis not present

## 2018-07-01 DIAGNOSIS — I110 Hypertensive heart disease with heart failure: Secondary | ICD-10-CM | POA: Diagnosis not present

## 2018-07-01 HISTORY — DX: Heart failure, unspecified: I50.9

## 2018-07-01 HISTORY — DX: Unspecified dementia, unspecified severity, without behavioral disturbance, psychotic disturbance, mood disturbance, and anxiety: F03.90

## 2018-07-01 HISTORY — DX: Essential (primary) hypertension: I10

## 2018-07-01 LAB — CBC WITH DIFFERENTIAL/PLATELET
BAND NEUTROPHILS: 0 %
BASOS ABS: 0 10*3/uL (ref 0.0–0.1)
BASOS PCT: 0 %
Blasts: 0 %
EOS ABS: 0 10*3/uL (ref 0.0–0.7)
Eosinophils Relative: 0 %
HCT: 25.9 % — ABNORMAL LOW (ref 39.0–52.0)
Hemoglobin: 8.7 g/dL — ABNORMAL LOW (ref 13.0–17.0)
LYMPHS ABS: 1.4 10*3/uL (ref 0.7–4.0)
Lymphocytes Relative: 15 %
MCH: 31 pg (ref 26.0–34.0)
MCHC: 33.6 g/dL (ref 30.0–36.0)
MCV: 92.2 fL (ref 78.0–100.0)
METAMYELOCYTES PCT: 0 %
MONOS PCT: 5 %
Monocytes Absolute: 0.5 10*3/uL (ref 0.1–1.0)
Myelocytes: 0 %
NEUTROS ABS: 7.3 10*3/uL (ref 1.7–7.7)
Neutrophils Relative %: 80 %
Platelets: 125 10*3/uL — ABNORMAL LOW (ref 150–400)
Promyelocytes Relative: 0 %
RBC: 2.81 MIL/uL — ABNORMAL LOW (ref 4.22–5.81)
RDW: 15.4 % (ref 11.5–15.5)
WBC: 9.2 10*3/uL (ref 4.0–10.5)
nRBC: 0 /100 WBC

## 2018-07-01 LAB — URINALYSIS, ROUTINE W REFLEX MICROSCOPIC
Bilirubin Urine: NEGATIVE
Glucose, UA: NEGATIVE mg/dL
Ketones, ur: NEGATIVE mg/dL
Nitrite: NEGATIVE
PROTEIN: 100 mg/dL — AB
Specific Gravity, Urine: 1.016 (ref 1.005–1.030)
pH: 6 (ref 5.0–8.0)

## 2018-07-01 LAB — COMPREHENSIVE METABOLIC PANEL
ALT: 12 U/L (ref 0–44)
ANION GAP: 8 (ref 5–15)
AST: 27 U/L (ref 15–41)
Albumin: 2.9 g/dL — ABNORMAL LOW (ref 3.5–5.0)
Alkaline Phosphatase: 21 U/L — ABNORMAL LOW (ref 38–126)
BUN: 136 mg/dL — ABNORMAL HIGH (ref 8–23)
CALCIUM: 9.1 mg/dL (ref 8.9–10.3)
CO2: 14 mmol/L — AB (ref 22–32)
Chloride: 116 mmol/L — ABNORMAL HIGH (ref 98–111)
Creatinine, Ser: 5.48 mg/dL — ABNORMAL HIGH (ref 0.61–1.24)
GFR, EST AFRICAN AMERICAN: 9 mL/min — AB (ref >60)
GFR, EST NON AFRICAN AMERICAN: 8 mL/min — AB (ref >60)
Glucose, Bld: 99 mg/dL (ref 70–99)
Potassium: 5.7 mmol/L — ABNORMAL HIGH (ref 3.5–5.1)
SODIUM: 138 mmol/L (ref 135–145)
Total Bilirubin: 0.8 mg/dL (ref 0.3–1.2)
Total Protein: 4.9 g/dL — ABNORMAL LOW (ref 6.5–8.1)

## 2018-07-01 LAB — MRSA PCR SCREENING: MRSA by PCR: POSITIVE — AB

## 2018-07-01 LAB — ABO/RH: ABO/RH(D): A POS

## 2018-07-01 LAB — TYPE AND SCREEN
ABO/RH(D): A POS
Antibody Screen: NEGATIVE

## 2018-07-01 LAB — CREATININE, URINE, RANDOM: Creatinine, Urine: 70.93 mg/dL

## 2018-07-01 LAB — SODIUM, URINE, RANDOM: SODIUM UR: 67 mmol/L

## 2018-07-01 MED ORDER — SODIUM CHLORIDE 0.9 % IV SOLN
1.0000 g | INTRAVENOUS | Status: DC
  Administered 2018-07-02: 1 g via INTRAVENOUS
  Filled 2018-07-01: qty 10

## 2018-07-01 MED ORDER — PANTOPRAZOLE SODIUM 40 MG IV SOLR
40.0000 mg | Freq: Two times a day (BID) | INTRAVENOUS | Status: DC
  Administered 2018-07-01 – 2018-07-02 (×2): 40 mg via INTRAVENOUS
  Filled 2018-07-01 (×2): qty 40

## 2018-07-01 MED ORDER — FENTANYL CITRATE (PF) 100 MCG/2ML IJ SOLN
25.0000 ug | INTRAMUSCULAR | Status: DC | PRN
  Administered 2018-07-01 – 2018-07-02 (×2): 50 ug via INTRAVENOUS
  Filled 2018-07-01 (×2): qty 2

## 2018-07-01 MED ORDER — SODIUM CHLORIDE 0.9% FLUSH: 3.0000 mL | INTRAVENOUS | Status: DC | PRN

## 2018-07-01 MED ORDER — ACETAMINOPHEN 650 MG RE SUPP: 650.0000 mg | Freq: Four times a day (QID) | RECTAL | Status: DC | PRN

## 2018-07-01 MED ORDER — ONDANSETRON HCL 4 MG/2ML IJ SOLN: 4.0000 mg | Freq: Four times a day (QID) | INTRAMUSCULAR | Status: DC | PRN

## 2018-07-01 MED ORDER — HYDRALAZINE HCL 20 MG/ML IJ SOLN: 10.0000 mg | INTRAMUSCULAR | Status: DC | PRN

## 2018-07-01 MED ORDER — SODIUM CHLORIDE 0.9 % IV SOLN: 250.0000 mL | INTRAVENOUS | Status: DC | PRN

## 2018-07-01 MED ORDER — SODIUM CHLORIDE 0.9% FLUSH
3.0000 mL | Freq: Two times a day (BID) | INTRAVENOUS | Status: DC
  Administered 2018-07-01: 3 mL via INTRAVENOUS

## 2018-07-01 MED ORDER — ONDANSETRON HCL 4 MG PO TABS: 4.0000 mg | ORAL_TABLET | Freq: Four times a day (QID) | ORAL | Status: DC | PRN

## 2018-07-01 MED ORDER — ACETAMINOPHEN 325 MG PO TABS: 650.0000 mg | ORAL_TABLET | Freq: Four times a day (QID) | ORAL | Status: DC | PRN

## 2018-07-01 MED ORDER — LORAZEPAM 2 MG/ML IJ SOLN
0.5000 mg | Freq: Once | INTRAMUSCULAR | Status: AC
  Administered 2018-07-02: 0.5 mg via INTRAVENOUS
  Filled 2018-07-01: qty 1

## 2018-07-01 NOTE — H&P (Signed)
History and Physical    Samuel Nolan ONG:[id number] DOB: 1926-07-22 DOA: 07/01/2018  PCP: Shelbie Ammons, MD   Patient coming from: SNF, by way of [facility] ED   Chief Complaint: Altered mental status   HPI: Samuel Nolan is a 84 y.o. male with medical history significant for hypertension, dementia, CHF, right ureteral obstruction status post stent, and chronic kidney disease, now presenting to the emergency department for evaluation of increased confusion and severe pain "all over."  Patient is accompanied by his family who assist with the history.  He had reportedly developed gross hematuria and right flank pain approximately 3 months ago, was found to have obstruction, nephrostomy tube was placed, and ureteral stent was later placed on 04/21/2018 with subsequent removal of the nephrostomy tube.  Patient has been in a nursing facility since then, has been increasingly confused, was recently diagnosed with UTI and started on ciprofloxacin.  Confusion increased and he began to complain of severe pain "all over."  There has not been any vomiting reported, no diarrhea, and family had not noted any blood in his stool or melena.  [facility] ED Course: Upon arrival to the ED, patient is found to be afebrile, saturating well on 2 L/min supplemental oxygen, and with vitals otherwise stable.  Chemistry panel is notable for a potassium of 5.4, bicarbonate 15, BUN 137, and creatinine 6.0, up from 3.28-month earlier.  CBC features a hemoglobin of 6.7, down from 10.3 a month earlier.  Fecal occult blood testing was positive.  Noncontrast head CT is negative for acute intracranial abnormality.  Chest x-ray was notable for patchy consolidation at the right base suspicious for possible pneumonia.  Patient was given 1 L of normal saline, 1 g IV Rocephin, and gastroenterology was consulted by the ED physician.  Family reports that the patient was also given 2 units of RBC in the ED.  Admission to [facility] was  arranged due to lack of GI coverage at [facility].  Review of Systems:  All other systems reviewed and apart from HPI, are negative.  Past Medical History:  Diagnosis Date  . CHF (congestive heart failure) (HCC)   . Dementia   . Hypertension     History reviewed. No pertinent surgical history.   reports that he drank alcohol. He reports that he has current or past drug history. His tobacco history is not on file.  Allergies not on file  Family History  Problem Relation Age of Onset  . Hypertension Other      Prior to Admission medications   Not on File    Physical Exam: Vitals:   07/01/18 1842  BP: (!) 147/94  Pulse: 70  Resp: (!) 32  Temp: 98.5 F (36.9 C)  TempSrc: Oral  SpO2: 100%     Constitutional: NAD, appears uncomfortable, no pallor, no diaphoresis  Eyes: PERTLA, lids and conjunctivae normal ENMT: Mucous membranes are moist. Posterior pharynx clear of any exudate or lesions.   Neck: normal, supple, no masses, no thyromegaly Respiratory: mild tachypnea, no wheezing, no crackles. No accessory muscle use.  Cardiovascular: S1 & S2 heard, regular rate and rhythm. No extremity edema.   Abdomen: No distension, no tenderness, soft. Bowel sounds normal.  Musculoskeletal: no clubbing / cyanosis. No joint deformity upper and lower extremities.    Skin: no significant rashes, lesions, ulcers. Warm, dry, well-perfused. Neurologic: No facial asymmetry. Resting tremor involving RUE. Sensation intact. Moving all extremities.  Psychiatric: Alert and oriented to person only. Calm, cooperative.  Labs on Admission: I have personally reviewed following labs and imaging studies  CBC: No results for input(s): WBC, NEUTROABS, HGB, HCT, MCV, PLT in the last 168 hours. Basic Metabolic Panel: No results for input(s): NA, K, CL, CO2, GLUCOSE, BUN, CREATININE, CALCIUM, MG, PHOS in the last 168 hours. GFR: CrCl cannot be calculated (No successful lab value found.). Liver  Function Tests: No results for input(s): AST, ALT, ALKPHOS, BILITOT, PROT, ALBUMIN in the last 168 hours. No results for input(s): LIPASE, AMYLASE in the last 168 hours. No results for input(s): AMMONIA in the last 168 hours. Coagulation Profile: No results for input(s): INR, PROTIME in the last 168 hours. Cardiac Enzymes: No results for input(s): CKTOTAL, CKMB, CKMBINDEX, TROPONINI in the last 168 hours. BNP (last 3 results) No results for input(s): PROBNP in the last 8760 hours. HbA1C: No results for input(s): HGBA1C in the last 72 hours. CBG: No results for input(s): GLUCAP in the last 168 hours. Lipid Profile: No results for input(s): CHOL, HDL, LDLCALC, TRIG, CHOLHDL, LDLDIRECT in the last 72 hours. Thyroid Function Tests: No results for input(s): TSH, T4TOTAL, FREET4, T3FREE, THYROIDAB in the last 72 hours. Anemia Panel: No results for input(s): VITAMINB12, FOLATE, FERRITIN, TIBC, IRON, RETICCTPCT in the last 72 hours. Urine analysis: No results found for: COLORURINE, APPEARANCEUR, LABSPEC, PHURINE, GLUCOSEU, HGBUR, BILIRUBINUR, KETONESUR, PROTEINUR, UROBILINOGEN, NITRITE, LEUKOCYTESUR Sepsis Labs: @LABRCNTIP (procalcitonin:4,lacticidven:4) )No results found for this or any previous visit (from the past 240 hour(s)).   Radiological Exams on Admission: No results found.  EKG: Ordered, not yet performed.   Assessment/Plan  1. GI bleeding; acute on chronic anemia  - Presents to ED with increased confusion and severe pain "all over," and is found to have Hgb of 6.7, down from 10.3 a month earlier  - FOBT was positive and family reports that he was transfused with 2 units RBC in the ED  - No vomiting reported, abdominal exam benign, BUN very high but so is creatinine  - GI is consulting and much appreciated, will follow-up on recommendations  - Type and screen, check CBC now, start IV PPI q12h    2. Acute renal failure superimposed on CKD 4; hyperkalemia  - Presents to ED  with increased confusion and severe pain "all over," and is found to have creatinine of 6.0, up from 3.0 a month earlier  - Serum potassium is 5.4 in ED  - He has right ureteral stent that was placed 04/21/18 per family report  - He was given a liter of NS in ED  - Repeat chem panel and check EKG now in light of increased potassium, check urine chemistries and renal US, avoid nephrotoxins, renally-dose medications, repeat chemistry panel    3. Acute encephalopathy  - Patient has hx of dementia, but has reportedly had increased confusion recently  - Head CT was negative for acute intracranial abnormality in ED, no focal findings on exam  - BUN is 137 and could certainly be contributing; he is also being treated for UTI and complaining of severe pain "all over"   - Continue to treat underlying conditions, continue supportive care    4. UTI  - Patient has had increased confusion, found to have UTI secondary to Enterococcus and appears to have been on Cipro, but one note says Bactrim  - He is afebrile on admission with no leukocytosis  - Check UA and culture, continue treatment with Rocephin for now    5. Right ureteral obstruction  - Family reports that percutaneous  nephrostomy tube was placed on the right side ~3 mos ago and then right ureteral stent was placed on 04/21/18 with subsequent removal of the nephrostomy tube  - As above, SCr has increased significantly and patient is being treated for UTI  - Check renal US, UA    6. Hypertension  - BP at goal  - Hold lisinopril in light of AKI, use hydralazine IVP's as needed for now   7. Chronic CHF  - Appears compensated  - No echo report on file, EF unknown  - Hold Lasix and ACE in light of AKI - Follow daily wt and I/O's   8. ?Pneumonia - CXR from [facility] ED read (images not available) as suspicious for PNA in right base  - No fever or leukocytosis, and no rhonchi appreciated  - Check CXR here    DVT prophylaxis: SCD's   Code  Status: DNR  Family Communication: Son and daughter updated at bedside   Consults called: GI  Admission status: Inpatient    Briscoe Deutscher, MD [facility] Pager [phone]  If 7PM-7AM, please contact night-coverage [email or web address] Password [facility]  07/01/2018, 8:32 PM

## 2018-07-01 NOTE — Progress Notes (Signed)
Hannah BeatJerry Rudzinski is a 84 y.o. male patient admitted from [facility] , alert - oriented to person - no acute distress noted.  VSS - Blood pressure (!) 147/94, pulse 70, temperature 98.5 F (36.9 C), temperature source Oral, resp. rate (!) 32, SpO2 100 %.    IV in place, occlusive dsg intact without redness.  Orientation to room, and floor completed with information packet given to patient/family.  Patient declined safety video at this time.  Admission INP armband ID verified with patient/family, and in place.   SR up x 2, fall assessment complete, with patient and family able to verbalize understanding of risk associated with falls, and verbalized understanding to call nsg before up out of bed.  Call light within reach, patient able to voice, and demonstrate understanding.  Skin, clean-dry- intact with generalized brusing with, No evidence of skin break down noted on exam.     Will cont to eval and treat per MD orders.  Eligah EastErin M Tris Howell, RN 07/01/2018 6:55 PM

## 2018-07-02 LAB — BASIC METABOLIC PANEL
ANION GAP: 7 (ref 5–15)
BUN: 131 mg/dL — ABNORMAL HIGH (ref 8–23)
CALCIUM: 9.6 mg/dL (ref 8.9–10.3)
CO2: 14 mmol/L — ABNORMAL LOW (ref 22–32)
Chloride: 122 mmol/L — ABNORMAL HIGH (ref 98–111)
Creatinine, Ser: 5.31 mg/dL — ABNORMAL HIGH (ref 0.61–1.24)
GFR, EST AFRICAN AMERICAN: 10 mL/min — AB (ref >60)
GFR, EST NON AFRICAN AMERICAN: 9 mL/min — AB (ref >60)
GLUCOSE: 44 mg/dL — AB (ref 70–99)
POTASSIUM: 5 mmol/L (ref 3.5–5.1)
SODIUM: 143 mmol/L (ref 135–145)

## 2018-07-02 LAB — GLUCOSE, CAPILLARY
GLUCOSE-CAPILLARY: 36 mg/dL — AB (ref 70–99)
GLUCOSE-CAPILLARY: 101 mg/dL — AB (ref 70–99)
GLUCOSE-CAPILLARY: 112 mg/dL — AB (ref 70–99)

## 2018-07-02 MED ORDER — ALBUTEROL SULFATE (2.5 MG/3ML) 0.083% IN NEBU: 2.5000 mg | INHALATION_SOLUTION | RESPIRATORY_TRACT | Status: DC | PRN

## 2018-07-02 MED ORDER — FENTANYL CITRATE (PF) 100 MCG/2ML IJ SOLN: 25.0000 ug | INTRAMUSCULAR | 0 refills | Status: AC | PRN

## 2018-07-02 MED ORDER — ALBUTEROL SULFATE (2.5 MG/3ML) 0.083% IN NEBU: 2.5000 mg | INHALATION_SOLUTION | RESPIRATORY_TRACT | 12 refills | Status: AC | PRN

## 2018-07-02 MED ORDER — SODIUM CHLORIDE 0.9 % IV SOLN
INTRAVENOUS | Status: DC
  Administered 2018-07-02: 04:00:00 via INTRAVENOUS

## 2018-07-02 MED ORDER — LORAZEPAM 2 MG/ML IJ SOLN
1.0000 mg | INTRAMUSCULAR | Status: DC | PRN
  Administered 2018-07-02: 1 mg via INTRAVENOUS
  Filled 2018-07-02: qty 1

## 2018-07-02 MED ORDER — SODIUM BICARBONATE 8.4 % IV SOLN
INTRAVENOUS | Status: DC
  Administered 2018-07-02: 10:00:00 via INTRAVENOUS
  Filled 2018-07-02: qty 150

## 2018-07-02 MED ORDER — OXYCODONE HCL 20 MG/ML PO CONC
5.0000 mg | ORAL | Status: DC | PRN
  Filled 2018-07-02: qty 1

## 2018-07-02 MED ORDER — SODIUM CHLORIDE 0.9 % IV SOLN
1.0000 g | Freq: Once | INTRAVENOUS | Status: AC
  Administered 2018-07-02: 1 g via INTRAVENOUS
  Filled 2018-07-02: qty 10

## 2018-07-02 MED ORDER — DIPHENHYDRAMINE HCL 50 MG/ML IJ SOLN: 12.5000 mg | INTRAMUSCULAR | Status: DC | PRN

## 2018-07-02 MED ORDER — ATROPINE SULFATE 1 % OP SOLN
4.0000 [drp] | OPHTHALMIC | Status: DC | PRN
  Filled 2018-07-02: qty 2

## 2018-07-02 MED ORDER — SODIUM CHLORIDE 0.9% FLUSH: 3.0000 mL | Freq: Two times a day (BID) | INTRAVENOUS | Status: DC

## 2018-07-02 MED ORDER — HALOPERIDOL 0.5 MG PO TABS
0.5000 mg | ORAL_TABLET | ORAL | Status: DC | PRN
  Filled 2018-07-02: qty 1

## 2018-07-02 MED ORDER — SODIUM CHLORIDE 0.9 % IV SOLN: 250.0000 mL | INTRAVENOUS | Status: DC | PRN

## 2018-07-02 MED ORDER — LORAZEPAM 2 MG/ML PO CONC: 1.0000 mg | ORAL | Status: DC | PRN

## 2018-07-02 MED ORDER — OXYCODONE HCL 20 MG/ML PO CONC: 5.0000 mg | ORAL | 0 refills | Status: AC | PRN

## 2018-07-02 MED ORDER — OXYCODONE HCL 20 MG/ML PO CONC
5.0000 mg | ORAL | Status: DC | PRN
  Administered 2018-07-02: 5 mg via SUBLINGUAL
  Filled 2018-07-02: qty 1

## 2018-07-02 MED ORDER — LORAZEPAM 1 MG PO TABS: 1.0000 mg | ORAL_TABLET | ORAL | Status: DC | PRN

## 2018-07-02 MED ORDER — HALOPERIDOL LACTATE 5 MG/ML IJ SOLN: 0.5000 mg | INTRAMUSCULAR | Status: DC | PRN

## 2018-07-02 MED ORDER — DEXTROSE 50 % IV SOLN
INTRAVENOUS | Status: AC
  Administered 2018-07-02: 50 mL
  Filled 2018-07-02: qty 50

## 2018-07-02 MED ORDER — HALOPERIDOL LACTATE 2 MG/ML PO CONC
0.5000 mg | ORAL | Status: DC | PRN
  Filled 2018-07-02: qty 0.3

## 2018-07-02 MED ORDER — ONDANSETRON 4 MG PO TBDP: 4.0000 mg | ORAL_TABLET | Freq: Four times a day (QID) | ORAL | Status: DC | PRN

## 2018-07-02 MED ORDER — SODIUM CHLORIDE 0.9% FLUSH: 3.0000 mL | INTRAVENOUS | Status: DC | PRN

## 2018-07-02 MED ORDER — SODIUM BICARBONATE 8.4 % IV SOLN
INTRAVENOUS | Status: DC
  Filled 2018-07-02: qty 1000

## 2018-07-02 MED ORDER — OXYBUTYNIN CHLORIDE 5 MG PO TABS: 2.5000 mg | ORAL_TABLET | Freq: Four times a day (QID) | ORAL | Status: DC | PRN

## 2018-07-02 MED ORDER — SENNA 8.6 MG PO TABS: 1.0000 | ORAL_TABLET | Freq: Every evening | ORAL | Status: DC | PRN

## 2018-07-02 MED ORDER — ATROPINE SULFATE 1 % OP SOLN: 4.0000 [drp] | OPHTHALMIC | 12 refills | Status: AC | PRN

## 2018-07-02 MED ORDER — DEXTROSE 50 % IV SOLN
1.0000 | Freq: Once | INTRAVENOUS | Status: AC
  Administered 2018-07-02: 50 mL via INTRAVENOUS
  Filled 2018-07-02: qty 50

## 2018-07-02 MED ORDER — ONDANSETRON HCL 4 MG/2ML IJ SOLN: 4.0000 mg | Freq: Four times a day (QID) | INTRAMUSCULAR | Status: DC | PRN

## 2018-07-02 MED ORDER — LORAZEPAM 2 MG/ML IJ SOLN: 1.0000 mg | INTRAMUSCULAR | Status: DC | PRN

## 2018-07-02 MED ORDER — INSULIN ASPART 100 UNIT/ML IV SOLN
10.0000 [IU] | Freq: Once | INTRAVENOUS | Status: AC
  Administered 2018-07-02: 10 [IU] via INTRAVENOUS

## 2018-07-02 NOTE — Progress Notes (Addendum)
SLP Cancellation Note  Patient Details Name: Samuel Nolan MRN: [id number] DOB: 1926/03/31   Cancelled treatment:       Reason Eval/Treat Not Completed: Other (comment) . Pt sleeping deeply, diet ordered, plan for transition to full comfort measures. Will defer SLP swallow eval orders at this time and sign off.  Harlon Ditty, MA CCC-SLP  Acute Rehabilitation Services Pager [phone] Office [phone]  Claudine Mouton 07/02/2018, 11:18 AM

## 2018-07-02 NOTE — Progress Notes (Signed)
Patient will DC to: [facility] Anticipated DC date: 07/02/18 Family notified: Son, [name] Transport by: Sharin Mons   Per MD patient ready for DC to [facility]. RN, patient, patient's family, and facility notified of DC. Discharge Summary sent to facility. RN given number for report. DC packet on chart. Ambulance transport requested for patient.   CSW signing off.  Cristobal Goldmann, LCSW Clinical Social Worker [phone]

## 2018-07-02 NOTE — Progress Notes (Signed)
Discussed with patient's son [name] at bedside-patient has been bedbound for the past 2-3 months at a local SNF.  He claims his father did not want any aggressive care.  He did not want dialysis.  After discussion-family agreeable to transition to full hospice care/comfort care.  Have placed consult for residential hospice.  All active treatments have been discontinued-comfort medications have been ordered.

## 2018-07-02 NOTE — Progress Notes (Signed)
Spoke with POA-Son Deirdre Evener [phone]). He is in agreement to send referral to [facility] for review.  Osborne Casco Clarisa Danser LCSW [phone]

## 2018-07-02 NOTE — Progress Notes (Addendum)
PROGRESS NOTE        PATIENT DETAILS Name: Samuel Nolan Age: 84 y.o. Sex: male Date of Birth: 05-10-1926 Admit Date: 07/01/2018 Admitting Physician Briscoe Deutscher, MD ZOX:[id number], Myrene Galas, MD  Brief Narrative: Patient is a 84 y.o. male with prior history of dementia, hypertension, recent right ureteral obstruction requiring stent placement presenting with altered mental status-thought to have acute metabolic encephalopathy due to acute kidney injury.  Also found to have a hemoglobin of 6.7 with FOBT positive stools.  See below for further details.  Subjective: No family at bedside-he seems to be very weak and frail-not sure what his ambulation status is at baseline.  He is able to tell me his name-he appears to have significant intentional tremors.  Unable to obtain any further history at this point.  Assessment/Plan: Acute metabolic encephalopathy: Suspect secondary to acute kidney injury.  This morning-although remains somewhat confused is able to answer some questions appropriately.  He does have history of dementia at baseline-but clearly is not at his usual self.  Will await arrival of family members to delineate further goals of care.  Acute kidney injury on CKD stage IV: Although he had a right ureteral obstruction in July-and required a stent placed-he does not appear to have significant amount of hydronephrosis/obstruction evident on renal ultrasound.  I suspect that acute kidney injury is mostly hemodynamically mediated-or it could be secondary to Bactrim as well.  His BUN is significantly elevated-he is definitely confused and be on his baseline-he has metabolic acidosis-and did have hyperkalemia on admission-however he is a very poor long-term candidate for hemodialysis-he is very frail, weak.  I did curbside with Dr. Flossie Dibble did agree that dialysis would not be beneficial to someone in this condition.  I did attempt to call family-but number and patient is not  accurate.  Have asked nursing staff to let me know when family members arrived.  He may be best served by initiation of hospice care.  In the meantime-continue with IV fluids, avoid nephrotoxic agents-and frequent bladder scans to make sure he is not retaining.  Complicated UTI: Unable to obtain any history regarding symptoms due to confusion-given overall clinical scenario/recent right ureteral stent placement-I suspect it is reasonable to continue with Rocephin for now.  Await cultures.  Non-anion gap metabolic acidosis: Secondary to AKI-changing IV fluid to bicarb.  ?  GI bleeding: He does not appear to have overt GI bleeding in the form of melena or hematochezia or even hematemesis (confirmed with RN staff).  His stools are FOBT positive-I suspect that he is not actively bleeding-he definitely could have chronic blood loss.  Suspect worsening anemia due to acute illness/worsening renal function.  Await GI input-continue with PPI.  Suspect he will be a poor candidate for endoscopic evaluation given his current clinical status.  Hypoglycemia: Suspect secondary to poor oral intake-he may have gotten insulin for hyperkalemia-monitor for now-check CBGs every 2 hours.  Not sure if he is a diabetic-we will await pharmacy to do medication reconciliation  Anemia: I suspect this is multifactorial-mostly secondary to acute illness on top of anemia of chronic disease in the setting of renal failure.  He may have some element of chronic blood loss as he is FOBT positive.  He apparently has been transfused 2 units of PRBC so far-hemoglobin currently stable.  Monitor periodically.  Recent history of right  ureteral stent: Renal ultrasound on admission and did not show any significant hydronephrosis-Per radiology, right hydronephrosis is significantly improved compared to July.  Supportive care for now.  Hypertension: Pressure controlled-continue to hold all antihypertensives.  Reported history of dementia: No  family at baseline-but suspect he is not at his usual baseline and has significant amount of superimposed encephalopathy.  Palliative care/advanced directives: DNR in place-unable to get in touch with family this morning-given his overall clinical situation/advanced age and frailty-he probably is not a candidate for aggressive care.  He is probably best served by gentle medical treatment-and if family is agreeable even transitioning to full hospice care.  Will await arrival of family.  DVT Prophylaxis: SCD's  Code Status: DNR  Family Communication: None at bedside-attempted to call family-tel number does not seem to be correct infection.  Disposition Plan: Remain inpatient not stable for discharge-will require several more days-we will await arrival of family to discuss goals of care and appropriate disposition.  Antimicrobial agents: Anti-infectives (From admission, onward)   Start     Dose/Rate Route Frequency Ordered Stop   07/02/18 1030  cefTRIAXone (ROCEPHIN) 1 g in sodium chloride 0.9 % 100 mL IVPB     1 g 200 mL/hr over 30 Minutes Intravenous Every 24 hours 07/01/18 1952        Procedures: None  CONSULTS:  None  Time spent: 40 minutes-Greater than 50% of this time was spent in counseling, explanation of diagnosis, planning of further management, and coordination of care.  MEDICATIONS: Scheduled Meds: . pantoprazole  40 mg Intravenous Q12H  . sodium chloride flush  3 mL Intravenous Q12H   Continuous Infusions: . cefTRIAXone (ROCEPHIN)  IV 1 g (07/02/18 0852)  .  sodium bicarbonate  infusion 1000 mL     PRN Meds:.acetaminophen **OR** acetaminophen, fentaNYL (SUBLIMAZE) injection, hydrALAZINE, ondansetron **OR** ondansetron (ZOFRAN) IV   PHYSICAL EXAM: Vital signs: Vitals:   07/01/18 1842 07/02/18 0627 07/02/18 0633 07/02/18 0801  BP: (!) 147/94 137/65    Pulse: 70 98    Resp: (!) 32 (!) 26    Temp: 98.5 F (36.9 C) 98.5 F (36.9 C)  97.7 F (36.5 C)    TempSrc: Oral Axillary  Oral  SpO2: 100% 97%    Weight:   70 kg    Filed Weights   07/02/18 0633  Weight: 70 kg   There is no height or weight on file to calculate BMI.   General appearance: Appears very frail-week-somewhat alert but mostly confused. Eyes:Pink conjunctiva HEENT: Atraumatic and Normocephalic Neck: supple Resp:Good air entry bilaterally, clear to auscultation anteriorly CVS: S1 S2 regular, somewhat tachycardic GI: Bowel sounds present, Non tender and not distended with no gaurding, rigidity or rebound.No organomegaly Extremities: B/L Lower Ext shows no edema, both legs are warm to touch Neurology: He is to have tremors-difficult exam-but seems to be nonfocal. Psychiatric: Confused-unable to evaluate Musculoskeletal:No digital cyanosis Skin:No Rash, warm and dry Wounds:N/A  I have personally reviewed following labs and imaging studies  LABORATORY DATA: CBC: Recent Labs  Lab 07/01/18 2012  WBC 9.2  NEUTROABS 7.3  HGB 8.7*  HCT 25.9*  MCV 92.2  PLT 125*    Basic Metabolic Panel: Recent Labs  Lab 07/01/18 2308 07/02/18 0424  NA 138 143  K 5.7* 5.0  CL 116* 122*  CO2 14* 14*  GLUCOSE 99 44*  BUN 136* 131*  CREATININE 5.48* 5.31*  CALCIUM 9.1 9.6    GFR: CrCl cannot be calculated (Unknown ideal weight.).  Liver  Function Tests: Recent Labs  Lab 07/01/18 2308  AST 27  ALT 12  ALKPHOS 21*  BILITOT 0.8  PROT 4.9*  ALBUMIN 2.9*   No results for input(s): LIPASE, AMYLASE in the last 168 hours. No results for input(s): AMMONIA in the last 168 hours.  Coagulation Profile: No results for input(s): INR, PROTIME in the last 168 hours.  Cardiac Enzymes: No results for input(s): CKTOTAL, CKMB, CKMBINDEX, TROPONINI in the last 168 hours.  BNP (last 3 results) No results for input(s): PROBNP in the last 8760 hours.  HbA1C: No results for input(s): HGBA1C in the last 72 hours.  CBG: Recent Labs  Lab 07/02/18 0652 07/02/18 0747  07/02/18 0800  GLUCAP 36* 112* 101*    Lipid Profile: No results for input(s): CHOL, HDL, LDLCALC, TRIG, CHOLHDL, LDLDIRECT in the last 72 hours.  Thyroid Function Tests: No results for input(s): TSH, T4TOTAL, FREET4, T3FREE, THYROIDAB in the last 72 hours.  Anemia Panel: No results for input(s): VITAMINB12, FOLATE, FERRITIN, TIBC, IRON, RETICCTPCT in the last 72 hours.  Urine analysis:    Component Value Date/Time   COLORURINE AMBER (A) 07/01/2018 2323   APPEARANCEUR HAZY (A) 07/01/2018 2323   LABSPEC 1.016 07/01/2018 2323   PHURINE 6.0 07/01/2018 2323   GLUCOSEU NEGATIVE 07/01/2018 2323   HGBUR LARGE (A) 07/01/2018 2323   BILIRUBINUR NEGATIVE 07/01/2018 2323   KETONESUR NEGATIVE 07/01/2018 2323   PROTEINUR 100 (A) 07/01/2018 2323   NITRITE NEGATIVE 07/01/2018 2323   LEUKOCYTESUR MODERATE (A) 07/01/2018 2323    Sepsis Labs: Lactic Acid, Venous No results found for: LATICACIDVEN  MICROBIOLOGY: Recent Results (from the past 240 hour(s))  MRSA PCR Screening     Status: Abnormal   Collection Time: 07/01/18  6:45 PM  Result Value Ref Range Status   MRSA by PCR POSITIVE (A) NEGATIVE Final    Comment:        The GeneXpert MRSA Assay (FDA approved for NASAL specimens only), is one component of a comprehensive MRSA colonization surveillance program. It is not intended to diagnose MRSA infection nor to guide or monitor treatment for MRSA infections. RESULT CALLED TO, READ BACK BY AND VERIFIED WITH: J.GRAY,RN AT 2302 BY L.PITT 07/01/18     RADIOLOGY STUDIES/RESULTS: US Renal  Result Date: 07/02/2018 CLINICAL DATA:  Acute mild chronic renal failure EXAM: RENAL / URINARY TRACT ULTRASOUND COMPLETE COMPARISON:  04/07/2018 CT abdomen/pelvis FINDINGS: Right Kidney: Length: 11.2 cm. Atrophic echogenic right renal parenchyma. Mild hydronephrosis, significantly improved since 04/07/2018 CT. No renal mass. Left Kidney: Length: 10.4 cm. Echogenic left renal parenchyma, which is  normal in thickness. No hydronephrosis. Exophytic simple 1.3 cm interpolar renal cyst. Bladder: Chronic mild diffuse bladder wall thickening and trabeculation. Tip right nephroureteral stent is seen in the bladder lumen. Persistent distal right ureterectasis. Enlarged prostate measuring 5.3 x 3.8 x 4.4 cm. IMPRESSION: 1. Mild right hydronephrosis, significantly improved since 04/07/2018 CT. Distal tip of right nephroureteral stent is seen in the bladder lumen. Persistent distal right ureterectasis. 2. No left hydronephrosis. 3. Chronic atrophy of the right renal parenchyma. Normal size left kidney. Kidneys are echogenic, compatible with nonspecific acute on chronic renal parenchymal disease. 4. Chronic mild diffuse bladder wall thickening and trabeculation, suggesting chronic bladder outlet obstruction by the enlarged prostate. Electronically Signed   By: Delbert Phenix M.D.   On: 07/02/2018 00:38   Dg Chest Port 1 View  Result Date: 07/01/2018 CLINICAL DATA:  Respiratory distress with increased dyspnea and right back pain. EXAM: PORTABLE CHEST 1  VIEW COMPARISON:  07/01/2018 at 1050 hours FINDINGS: Stable cardiomegaly with aortic atherosclerosis. No overt pulmonary edema. Mild emphysematous hyperinflation of the lungs, left upper lobe greater than right. Patchy opacities at the right lung base appear to have cleared and may have represented areas of atelectasis. Mild diffuse interstitial prominence suspicious for areas of fibrosis are identified at each lung base and along the periphery of the lungs bilaterally. IMPRESSION: Interval clearing of suspected pneumonia from the right base and therefore may have represented atelectasis as opposed to pneumonia. Subpleural areas of fine reticular lung markings compatible with interstitial fibrosis. Mild upper lobe emphysema, left greater than right. Electronically Signed   By: Tollie Eth M.D.   On: 07/01/2018 21:19     LOS: 1 day   Jeoffrey Massed, MD  [facility]  If 7PM-7AM, please contact night-coverage  Please page via [email or web address]-Password [email or web address]-click on MD name and type text message  07/02/2018, 9:53 AM

## 2018-07-02 NOTE — Progress Notes (Signed)
Patient has been accept by [facility]. Patient's son completed paperwork.   Osborne Casco Kyo Cocuzza LCSW [phone]

## 2018-07-02 NOTE — Care Management Note (Signed)
Case Management Note  Patient Details  Name: Samuel Nolan MRN: [id number] Date of Birth: 1925/11/30  Subjective/Objective:   Admitted with AMS, hx of hypertension, dementia, CHF, right ureteral obstruction status post stent, and chronic kidney disease .Pt from [facility] /SNF.             [name] (son)           PCP: Dr. Donnel Saxon  Action/Plan: Transition to residential hospice.Marland KitchenMarland KitchenNCM made CSW aware. CWS will make referral with [facility] for residential hospice care per family's request. NCM will continue to monitor for transition of care needs.  Expected Discharge Date:                  Expected Discharge Plan:  Hospice Medical Facility  In-House Referral:  Clinical Social Work  Discharge planning Services  CM Consult  Post Acute Care Choice:    Choice offered to:     DME Arranged:    DME Agency:     HH Arranged:    HH Agency:     Status of Service:  In process, will continue to follow  If discussed at Long Length of Stay Meetings, dates discussed:    Additional Comments:  Epifanio Lesches, RN 07/02/2018, 10:36 AM

## 2018-07-02 NOTE — Progress Notes (Signed)
Patient has a critical glucose reported by lab. Performed a finger stick on patient and glucometer read 36. Md. Jerral Ralph was notified and an amp of Dextrose 50 was given. His blood sugar was rechecked and it read 112. Notified Public relations account executive.

## 2018-07-02 NOTE — Evaluation (Signed)
Physical Therapy Evaluation Patient Details Name: Samuel Nolan MRN: [id number] DOB: 08-11-1926 Today's Date: 07/02/2018   History of Present Illness  Pt is a 84 y.o. male admitted from SNF on 07/01/18 for increased confusion and pain. CT negative for acute intracranial abnormality. CXR with possible pneumonia. Worked up for GIB, awaiting GI consult. PMH includes dementia, HTN, CHF.    Clinical Impression  Pt presents with an overall decrease in functional mobility secondary to above. PTA, pt from SNF; poor historian, unable to provide details regarding PLOF. Today, pt required maxA to sit EOB; pt with increased respiration rate, agonal breathing, and tremors upon sitting limiting further mobility. Pt would benefit from continued acute PT services to maximize functional mobility and independence prior to return to SNF.     Follow Up Recommendations SNF;Supervision/Assistance - 24 hour    Equipment Recommendations  (TBD)    Recommendations for Other Services       Precautions / Restrictions Precautions Precautions: Fall Restrictions Weight Bearing Restrictions: No      Mobility  Bed Mobility Overal bed mobility: Needs Assistance Bed Mobility: Supine to Sit;Sit to Supine     Supine to sit: Max assist;HOB elevated Sit to supine: Mod assist   General bed mobility comments: Pt able to initiate movement to EOB with BLEs and reaching RUE to bed rail with cues, requiring maxA to bring hips to EOB and assist trunk elevation. ModA for return to supine and to scoot up in bed  Transfers                 General transfer comment: NT due to worsening tremors and agonal breathing/increased RR seated EOB  Ambulation/Gait                Stairs            Wheelchair Mobility    Modified Rankin (Stroke Patients Only)       Balance Overall balance assessment: Needs assistance   Sitting balance-Leahy Scale: Poor Sitting balance - Comments: Able to sit EOB with min  guard, but tremors quickly becoming worse and pt beginning to slide off EOB, unable to self-correct                                     Pertinent Vitals/Pain Pain Assessment: Faces Faces Pain Scale: Hurts little more Pain Location: Legs Pain Descriptors / Indicators: Grimacing Pain Intervention(s): Monitored during session;Repositioned    Home Living Family/patient expects to be discharged to:: Skilled nursing facility                      Prior Function Level of Independence: Needs assistance   Gait / Transfers Assistance Needed: Pt poor historian. Although answering some questions, not responding when asked specifics about PLOF/PT at SNF           Hand Dominance        Extremity/Trunk Assessment   Upper Extremity Assessment Upper Extremity Assessment: Generalized weakness    Lower Extremity Assessment Lower Extremity Assessment: Generalized weakness;RLE deficits/detail;LLE deficits/detail RLE Deficits / Details: Knee flex/ext 3/5, hip flex <3/5 LLE Deficits / Details: Knee flex/ext 3/5, hip flex <3/5    Cervical / Trunk Assessment Cervical / Trunk Assessment: Other exceptions Cervical / Trunk Exceptions: Pt initially with RUE/shoulder tremor, which quickly overcame BLEs and trunk when seated EOB; localized to R shoulder with return to supine  Communication  Communication: Expressive difficulties  Cognition Arousal/Alertness: Lethargic Behavior During Therapy: Flat affect Overall Cognitive Status: History of cognitive impairments - at baseline Area of Impairment: Orientation;Attention;Memory;Following commands;Awareness;Problem solving                 Orientation Level: Disoriented to;Place;Time;Situation Current Attention Level: Sustained Memory: Decreased short-term memory Following Commands: Follows one step commands inconsistently   Awareness: Intellectual Problem Solving: Slow processing;Decreased initiation;Requires verbal  cues;Requires tactile cues General Comments: Able to state Trump is president; alert to name/birthday. Knew he was at [facility] but did not know name. Able to answer <50% questions appropriately. Max cues to keep eyes open. Reports he has had the "shakes" "forever"      General Comments General comments (skin integrity, edema, etc.): BP 134/83, Spo2 96% on 2L O2 [place]    Exercises     Assessment/Plan    PT Assessment Patient needs continued PT services  PT Problem List Decreased strength;Decreased activity tolerance;Decreased range of motion;Decreased balance;Decreased mobility;Decreased coordination;Decreased cognition;Decreased safety awareness       PT Treatment Interventions DME instruction;Gait training;Functional mobility training;Therapeutic activities;Therapeutic exercise;Balance training;Cognitive remediation;Patient/family education    PT Goals (Current goals can be found in the Care Plan section)  Acute Rehab PT Goals Patient Stated Goal: None stated PT Goal Formulation: With patient Time For Goal Achievement: 19-Jul-2018    Frequency Min 2X/week   Barriers to discharge        Co-evaluation               AM-PAC PT "6 Clicks" Daily Activity  Outcome Measure Difficulty turning over in bed (including adjusting bedclothes, sheets and blankets)?: Unable Difficulty moving from lying on back to sitting on the side of the bed? : Unable Difficulty sitting down on and standing up from a chair with arms (e.g., wheelchair, bedside commode, etc,.)?: Unable Help needed moving to and from a bed to chair (including a wheelchair)?: A Lot Help needed walking in hospital room?: Total Help needed climbing 3-5 steps with a railing? : Total 6 Click Score: 7    End of Session Equipment Utilized During Treatment: Oxygen Activity Tolerance: Patient limited by fatigue Patient left: in bed;with call bell/phone within reach;with bed alarm set Nurse Communication: Mobility status PT Visit  Diagnosis: Other abnormalities of gait and mobility (R26.89);Muscle weakness (generalized) (M62.81);Other symptoms and signs involving the nervous system (Z61.096)    Time: 0454-0981 PT Time Calculation (min) (ACUTE ONLY): 18 min   Charges:   PT Evaluation $PT Eval Moderate Complexity: 1 Mod          Ina Homes, PT, DPT Acute Rehabilitation Services  Pager [phone] Office [phone]  Samuel Nolan 07/02/2018, 9:30 AM

## 2018-07-02 NOTE — Discharge Summary (Signed)
PATIENT DETAILS Name: Samuel Nolan Age: 84 y.o. Sex: male Date of Birth: 21-Jul-1926 MRN: [id number]. Admitting Physician: Briscoe Deutscher, MD WUJ:[id number], Myrene Galas, MD  Admit Date: 07/01/2018 Discharge date: 07/02/2018  Recommendations for Outpatient Follow-up:  Admit to inpatient hospice for symptom management of pain, dyspnea and anxiety Optimize comfort care  Admitted From:  SNF  Disposition: Residential Hospice   Home Health: No  Equipment/Devices: None  Discharge Condition: Stable/ guarded/hospice)  CODE STATUS: DNR  Diet recommendation:  Regular-comfort feeds  Brief Summary: See H&P, Labs, Consult and Test reports for all details in brief, Patient is a 84 y.o. male with prior history of dementia, hypertension, recent right ureteral obstruction requiring stent placement presenting with altered mental status-thought to have acute metabolic encephalopathy due to acute kidney injury.  Also found to have a hemoglobin of 6.7 with FOBT positive stools.After extensive d/w son-has been transitioned to comfort care, will transfer to residential hospice.   See below for further details.  Brief Hospital Course: Acute metabolic encephalopathy: Suspect secondary to acute kidney injury.  This morning-although remains somewhat confused is able to answer some questions appropriately.  He does have history of dementia at baseline-but clearly is not at his usual self.  After d/w family-has been transitioned to comfort care.  Acute kidney injury on CKD stage IV: Although he had a right ureteral obstruction in July-and required a stent placed-he does not appear to have significant amount of hydronephrosis/obstruction evident on renal ultrasound.  I suspect that acute kidney injury is mostly hemodynamically mediated-or it could be secondary to Bactrim as well. His BUN is significantly elevated-he is definitely confused and be on his baseline-he has metabolic acidosis-and did have  hyperkalemia on admission-however he is a very poor long-term candidate for hemodialysis-he is very frail, weak.  I did curbside with Dr. Flossie Dibble did agree that dialysis would not be beneficial to someone in this condition.  I subsequently spoke with the patient's son-he did confirm that the patient did not ever want any artificial means to keep him alive including dialysis.  After extensive discussion-family agreed to transition to hospice care.  Complicated UTI: Unable to obtain any history regarding symptoms due to confusion-given overall clinical scenario/recent right ureteral stent placement-patient was continued on IV Rocephin-cultures are pending-but since he has been transition to comfort care-have discontinued all antimicrobial therapy.   Non-anion gap metabolic acidosis: Secondary to AKI-placed on IV fluid with bicarb-stopping all IV fluids this full comfort measures in place.   ?  GI bleeding: He does not appear to have overt GI bleeding in the form of melena or hematochezia or even hematemesis (confirmed with RN staff).  His stools are FOBT positive-I suspect that he is not actively bleeding-he definitely could have chronic blood loss.  Suspect worsening anemia due to acute illness/worsening renal function.  Since patient has been transition to full comfort measures-do not think patient requires a GI consultation.  Hypoglycemia: Suspect secondary to poor oral intake-he may have gotten insulin for hyperkalemia-signs were to monitor CBG every 2 hours.  Since he has been transition to comfort care-no further monitoring required.   Anemia: I suspect this is multifactorial-mostly secondary to acute illness on top of anemia of chronic disease in the setting of renal failure.  He may have some element of chronic blood loss as he is FOBT positive.  He apparently has been transfused 2 units of PRBC so far-hemoglobin currently stable.    No indication to monitor any further as full comfort  measures  in effect.  Recent history of right ureteral stent: Renal ultrasound on admission and did not show any significant hydronephrosis-Per radiology, right hydronephrosis is significantly improved compared to July.    Hypertension: Pressure stable without the need to start any antihypertensives.  Reported history of dementia: No family at baseline-but suspect he is not at his usual baseline and has significant amount of superimposed encephalopathy.  Palliative care/advanced directives: DNR in place-after extensive discussion with his son at bedside-given poor overall prognosis-not HD candidate-family has decided to transition to full comfort measures-being transferred to residential hospice.  If he continues to have poor oral intake-and continues to have renal failure-his prognosis is probably less than 2 weeks.  He will be admitted to inpatient hospice for symptom management of pain, dyspnea and anxiety   Procedures/Studies: None  Discharge Diagnoses:  Principal Problem:   Acute GI bleeding Active Problems:   Dementia   Acute renal failure superimposed on stage 4 chronic kidney disease (HCC)   Hyperkalemia   Acute lower UTI   Acute encephalopathy   Ureteral obstruction, right   Essential hypertension   Chronic CHF [facility])   Discharge Instructions:  Activity:  As tolerated with Full fall precautions use walker/cane & assistance as needed   Discharge Instructions    Diet general   Complete by:  As directed      Allergies as of 07/02/2018   No Known Allergies     Medication List    STOP taking these medications   acetaminophen 325 MG tablet Commonly known as:  TYLENOL   cloNIDine 0.1 MG tablet Commonly known as:  CATAPRES   HYDROcodone-acetaminophen 5-325 MG tablet Commonly known as:  NORCO/VICODIN   ipratropium-albuterol 0.5-2.5 (3) MG/3ML Soln Commonly known as:  DUONEB   metaxalone 800 MG tablet Commonly known as:  SKELAXIN   OXYGEN   polyethylene glycol  packet Commonly known as:  MIRALAX / GLYCOLAX     TAKE these medications   albuterol (2.5 MG/3ML) 0.083% nebulizer solution Commonly known as:  PROVENTIL Take 3 mLs (2.5 mg total) by nebulization every 2 (two) hours as needed for wheezing.   atropine 1 % ophthalmic solution Place 4 drops under the tongue every 4 (four) hours as needed (excessive secretions).   fentaNYL 100 MCG/2ML injection Commonly known as:  SUBLIMAZE Inject 0.5-1 mLs (25-50 mcg total) into the vein every 2 (two) hours as needed for moderate pain or severe pain.   oxyCODONE 20 MG/ML concentrated solution Commonly known as:  ROXICODONE INTENSOL Take 0.3 mLs (6 mg total) by mouth every 2 (two) hours as needed for moderate pain (or dyspnea).      Follow-up Information    Shelbie Ammons, MD. Schedule an appointment as soon as possible for a visit.   Specialty:  Internal Medicine Why:  as needed  Contact information: [address] [place] [address] [phone]          No Known Allergies  Consultations:   None  Other Procedures/Studies: US Renal  Result Date: 07/02/2018 CLINICAL DATA:  Acute mild chronic renal failure EXAM: RENAL / URINARY TRACT ULTRASOUND COMPLETE COMPARISON:  04/07/2018 CT abdomen/pelvis FINDINGS: Right Kidney: Length: 11.2 cm. Atrophic echogenic right renal parenchyma. Mild hydronephrosis, significantly improved since 04/07/2018 CT. No renal mass. Left Kidney: Length: 10.4 cm. Echogenic left renal parenchyma, which is normal in thickness. No hydronephrosis. Exophytic simple 1.3 cm interpolar renal cyst. Bladder: Chronic mild diffuse bladder wall thickening and trabeculation. Tip right nephroureteral stent is seen  in the bladder lumen. Persistent distal right ureterectasis. Enlarged prostate measuring 5.3 x 3.8 x 4.4 cm. IMPRESSION: 1. Mild right hydronephrosis, significantly improved since 04/07/2018 CT. Distal tip of right nephroureteral stent is seen in the bladder lumen.  Persistent distal right ureterectasis. 2. No left hydronephrosis. 3. Chronic atrophy of the right renal parenchyma. Normal size left kidney. Kidneys are echogenic, compatible with nonspecific acute on chronic renal parenchymal disease. 4. Chronic mild diffuse bladder wall thickening and trabeculation, suggesting chronic bladder outlet obstruction by the enlarged prostate. Electronically Signed   By: Delbert Phenix M.D.   On: 07/02/2018 00:38   Dg Chest Port 1 View  Result Date: 07/01/2018 CLINICAL DATA:  Respiratory distress with increased dyspnea and right back pain. EXAM: PORTABLE CHEST 1 VIEW COMPARISON:  07/01/2018 at 1050 hours FINDINGS: Stable cardiomegaly with aortic atherosclerosis. No overt pulmonary edema. Mild emphysematous hyperinflation of the lungs, left upper lobe greater than right. Patchy opacities at the right lung base appear to have cleared and may have represented areas of atelectasis. Mild diffuse interstitial prominence suspicious for areas of fibrosis are identified at each lung base and along the periphery of the lungs bilaterally. IMPRESSION: Interval clearing of suspected pneumonia from the right base and therefore may have represented atelectasis as opposed to pneumonia. Subpleural areas of fine reticular lung markings compatible with interstitial fibrosis. Mild upper lobe emphysema, left greater than right. Electronically Signed   By: Tollie Eth M.D.   On: 07/01/2018 21:19      TODAY-DAY OF DISCHARGE:  Subjective:   Geovani Tootle today remains pleasantly confused  Objective:   Blood pressure 137/65, pulse 98, temperature 97.7 F (36.5 C), temperature source Oral, resp. rate (!) 26, weight 70 kg, SpO2 97 %.  Intake/Output Summary (Last 24 hours) at 07/02/2018 1401 Last data filed at 07/02/2018 0639 Gross per 24 hour  Intake 282.24 ml  Output 325 ml  Net -42.76 ml   Filed Weights   07/02/18 0633  Weight: 70 kg    Exam: Awake pleasantly confused not in any  distress [place].AT,PERRAL Supple Neck,No JVD, No cervical lymphadenopathy appriciated.  Symmetrical Chest wall movement, Good air movement bilaterally, CTAB RRR,No Gallops,Rubs or new Murmurs, No Parasternal Heave +ve B.Sounds, Abd Soft, Non tender, No organomegaly appriciated, No rebound -guarding or rigidity. No Cyanosis, Clubbing or edema, No new Rash or bruise   PERTINENT RADIOLOGIC STUDIES: US Renal  Result Date: 07/02/2018 CLINICAL DATA:  Acute mild chronic renal failure EXAM: RENAL / URINARY TRACT ULTRASOUND COMPLETE COMPARISON:  04/07/2018 CT abdomen/pelvis FINDINGS: Right Kidney: Length: 11.2 cm. Atrophic echogenic right renal parenchyma. Mild hydronephrosis, significantly improved since 04/07/2018 CT. No renal mass. Left Kidney: Length: 10.4 cm. Echogenic left renal parenchyma, which is normal in thickness. No hydronephrosis. Exophytic simple 1.3 cm interpolar renal cyst. Bladder: Chronic mild diffuse bladder wall thickening and trabeculation. Tip right nephroureteral stent is seen in the bladder lumen. Persistent distal right ureterectasis. Enlarged prostate measuring 5.3 x 3.8 x 4.4 cm. IMPRESSION: 1. Mild right hydronephrosis, significantly improved since 04/07/2018 CT. Distal tip of right nephroureteral stent is seen in the bladder lumen. Persistent distal right ureterectasis. 2. No left hydronephrosis. 3. Chronic atrophy of the right renal parenchyma. Normal size left kidney. Kidneys are echogenic, compatible with nonspecific acute on chronic renal parenchymal disease. 4. Chronic mild diffuse bladder wall thickening and trabeculation, suggesting chronic bladder outlet obstruction by the enlarged prostate. Electronically Signed   By: Delbert Phenix M.D.   On: 07/02/2018 00:38   Dg  Chest Port 1 View  Result Date: 07/01/2018 CLINICAL DATA:  Respiratory distress with increased dyspnea and right back pain. EXAM: PORTABLE CHEST 1 VIEW COMPARISON:  07/01/2018 at 1050 hours FINDINGS: Stable  cardiomegaly with aortic atherosclerosis. No overt pulmonary edema. Mild emphysematous hyperinflation of the lungs, left upper lobe greater than right. Patchy opacities at the right lung base appear to have cleared and may have represented areas of atelectasis. Mild diffuse interstitial prominence suspicious for areas of fibrosis are identified at each lung base and along the periphery of the lungs bilaterally. IMPRESSION: Interval clearing of suspected pneumonia from the right base and therefore may have represented atelectasis as opposed to pneumonia. Subpleural areas of fine reticular lung markings compatible with interstitial fibrosis. Mild upper lobe emphysema, left greater than right. Electronically Signed   By: Tollie Eth M.D.   On: 07/01/2018 21:19     PERTINENT LAB RESULTS: CBC: Recent Labs    07/01/18 2012  WBC 9.2  HGB 8.7*  HCT 25.9*  PLT 125*   CMET CMP     Component Value Date/Time   NA 143 07/02/2018 0424   K 5.0 07/02/2018 0424   CL 122 (H) 07/02/2018 0424   CO2 14 (L) 07/02/2018 0424   GLUCOSE 44 (LL) 07/02/2018 0424   BUN 131 (H) 07/02/2018 0424   CREATININE 5.31 (H) 07/02/2018 0424   CALCIUM 9.6 07/02/2018 0424   PROT 4.9 (L) 07/01/2018 2308   ALBUMIN 2.9 (L) 07/01/2018 2308   AST 27 07/01/2018 2308   ALT 12 07/01/2018 2308   ALKPHOS 21 (L) 07/01/2018 2308   BILITOT 0.8 07/01/2018 2308   GFRNONAA 9 (L) 07/02/2018 0424   GFRAA 10 (L) 07/02/2018 0424    GFR CrCl cannot be calculated (Unknown ideal weight.). No results for input(s): LIPASE, AMYLASE in the last 72 hours. No results for input(s): CKTOTAL, CKMB, CKMBINDEX, TROPONINI in the last 72 hours. Invalid input(s): POCBNP No results for input(s): DDIMER in the last 72 hours. No results for input(s): HGBA1C in the last 72 hours. No results for input(s): CHOL, HDL, LDLCALC, TRIG, CHOLHDL, LDLDIRECT in the last 72 hours. No results for input(s): TSH, T4TOTAL, T3FREE, THYROIDAB in the last 72  hours.  Invalid input(s): FREET3 No results for input(s): VITAMINB12, FOLATE, FERRITIN, TIBC, IRON, RETICCTPCT in the last 72 hours. Coags: No results for input(s): INR in the last 72 hours.  Invalid input(s): PT Microbiology: Recent Results (from the past 240 hour(s))  MRSA PCR Screening     Status: Abnormal   Collection Time: 07/01/18  6:45 PM  Result Value Ref Range Status   MRSA by PCR POSITIVE (A) NEGATIVE Final    Comment:        The GeneXpert MRSA Assay (FDA approved for NASAL specimens only), is one component of a comprehensive MRSA colonization surveillance program. It is not intended to diagnose MRSA infection nor to guide or monitor treatment for MRSA infections. RESULT CALLED TO, READ BACK BY AND VERIFIED WITH: J.GRAY,RN AT 2302 BY L.PITT 07/01/18     FURTHER DISCHARGE INSTRUCTIONS:  Get Medicines reviewed and adjusted: Please take all your medications with you for your next visit with your Primary MD  Laboratory/radiological data: Please request your Primary MD to go over all hospital tests and procedure/radiological results at the follow up, please ask your Primary MD to get all Hospital records sent to his/her office.  In some cases, they will be blood work, cultures and biopsy results pending at the time of your discharge.  Please request that your primary care M.D. goes through all the records of your hospital data and follows up on these results.  Also Note the following: If you experience worsening of your admission symptoms, develop shortness of breath, life threatening emergency, suicidal or homicidal thoughts you must seek medical attention immediately by calling 911 or calling your MD immediately  if symptoms less severe.  You must read complete instructions/literature along with all the possible adverse reactions/side effects for all the Medicines you take and that have been prescribed to you. Take any new Medicines after you have completely understood  and accpet all the possible adverse reactions/side effects.   Do not drive when taking Pain medications or sleeping medications (Benzodaizepines)  Do not take more than prescribed Pain, Sleep and Anxiety Medications. It is not advisable to combine anxiety,sleep and pain medications without talking with your primary care practitioner  Special Instructions: If you have smoked or chewed Tobacco  in the last 2 yrs please stop smoking, stop any regular Alcohol  and or any Recreational drug use.  Wear Seat belts while driving.  Please note: You were cared for by a hospitalist during your hospital stay. Once you are discharged, your primary care physician will handle any further medical issues. Please note that NO REFILLS for any discharge medications will be authorized once you are discharged, as it is imperative that you return to your primary care physician (or establish a relationship with a primary care physician if you do not have one) for your post hospital discharge needs so that they can reassess your need for medications and monitor your lab values.  Total Time spent coordinating discharge including counseling, education and face to face time equals 35 minutes.  SignedJeoffrey Massed 07/02/2018 2:01 PM

## 2018-07-03 LAB — URINE CULTURE: CULTURE: NO GROWTH

## 2018-07-03 LAB — UREA NITROGEN, URINE: Urea Nitrogen, Ur: 892 mg/dL

## 2018-08-07 DEATH — deceased

## 2020-07-29 IMAGING — US US RENAL
1 series · 13 of 25 positions shown · non-contrast
Comparison: 04/07/2018 CT abdomen/pelvis

CLINICAL DATA: Acute mild chronic renal failure

EXAM:
RENAL / URINARY TRACT ULTRASOUND COMPLETE

[Series 1: us renal · 0.22mm/px · 13 of 39 slices shown]
[im 1/39]
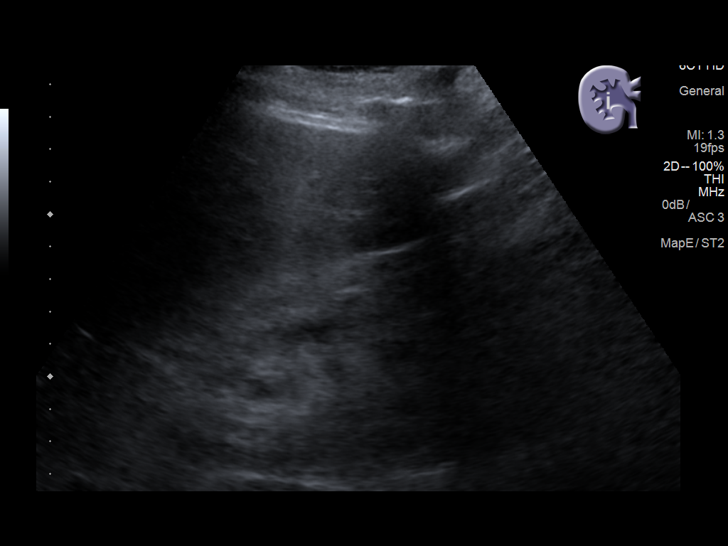
[im 4/39]
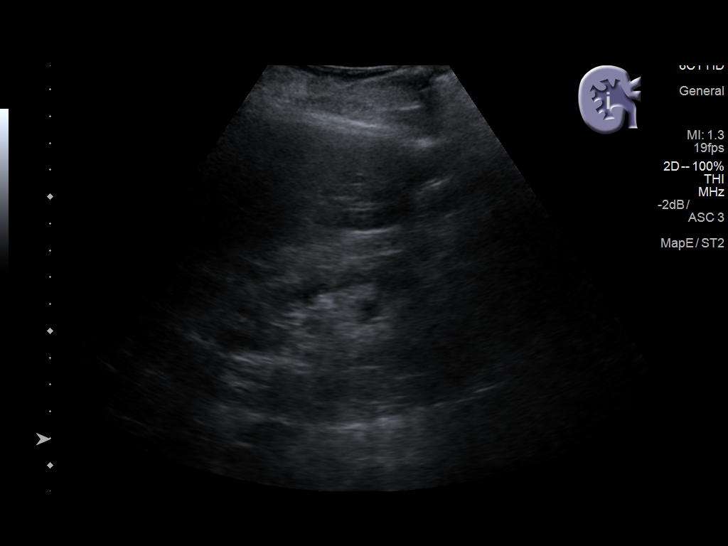
[im 7/39]
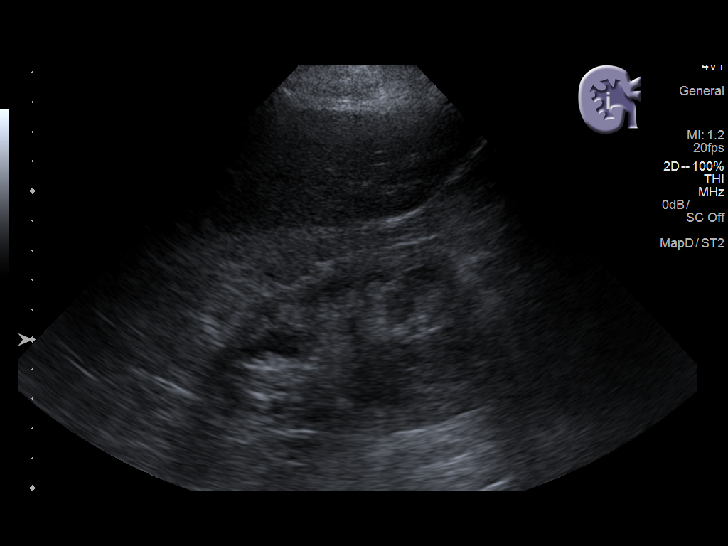
[im 10/39]
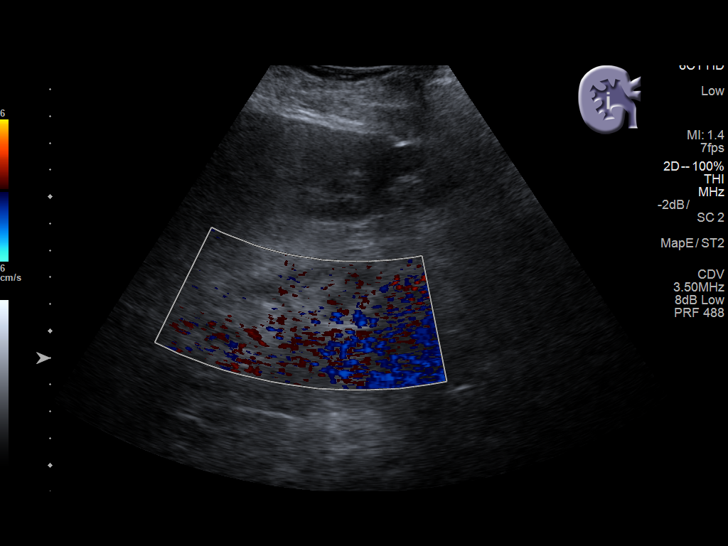
[im 13/39]
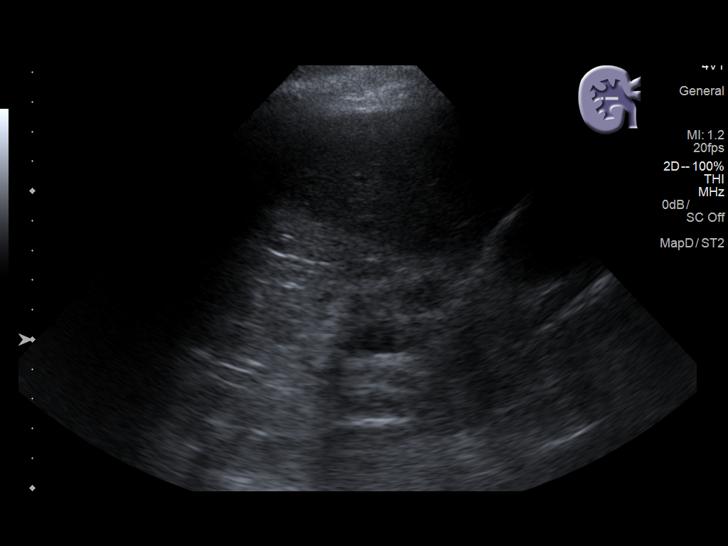
[im 16/39]
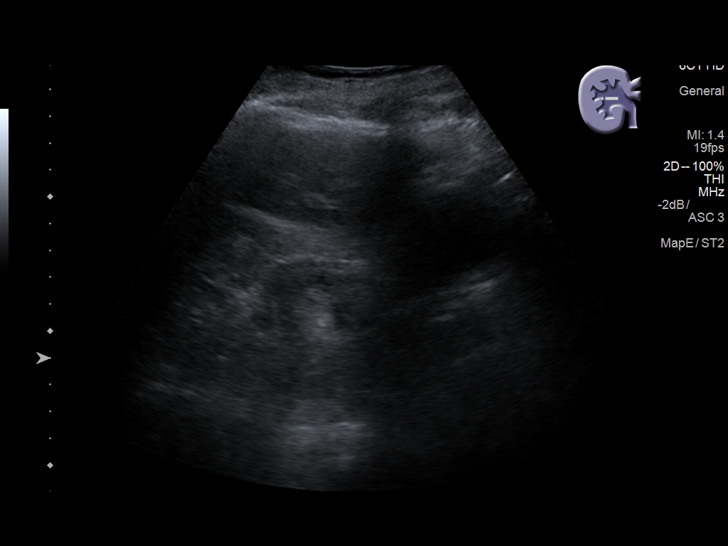
[im 20/39]
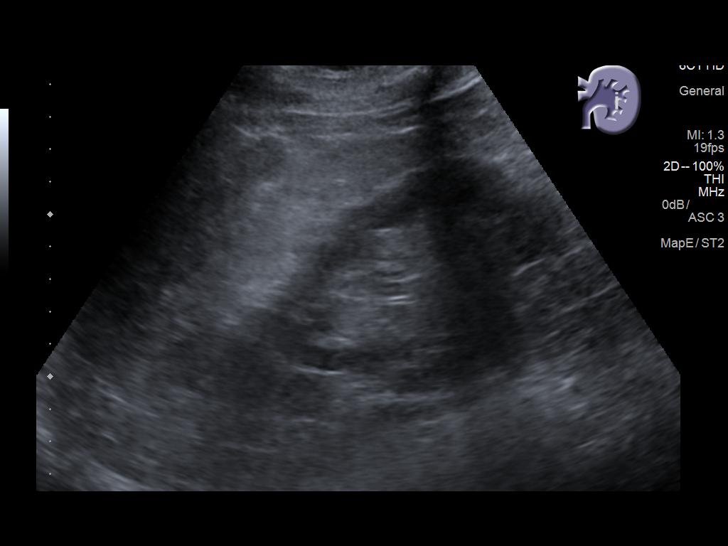
[im 23/39]
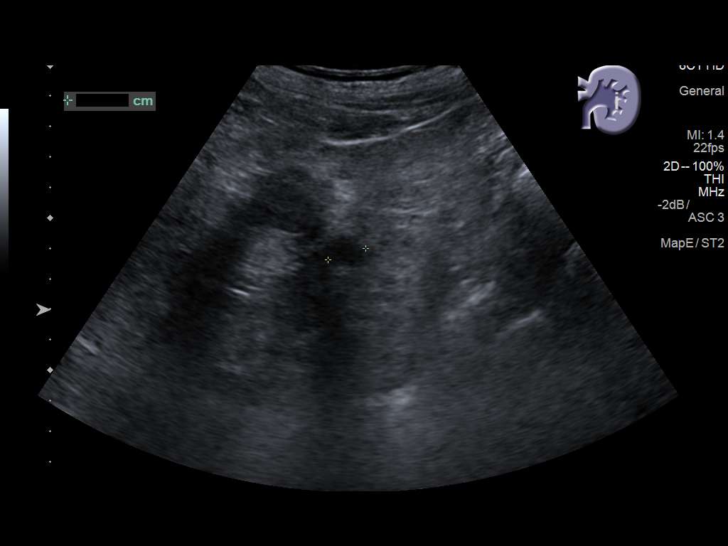
[im 26/39]
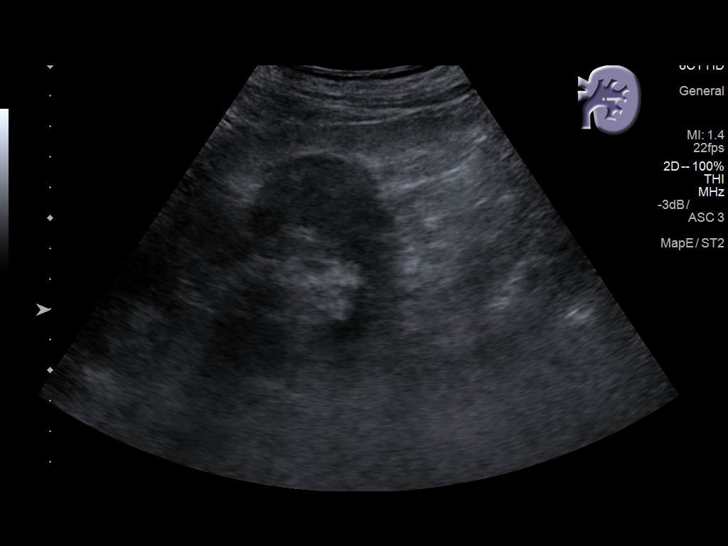
[im 29/39]
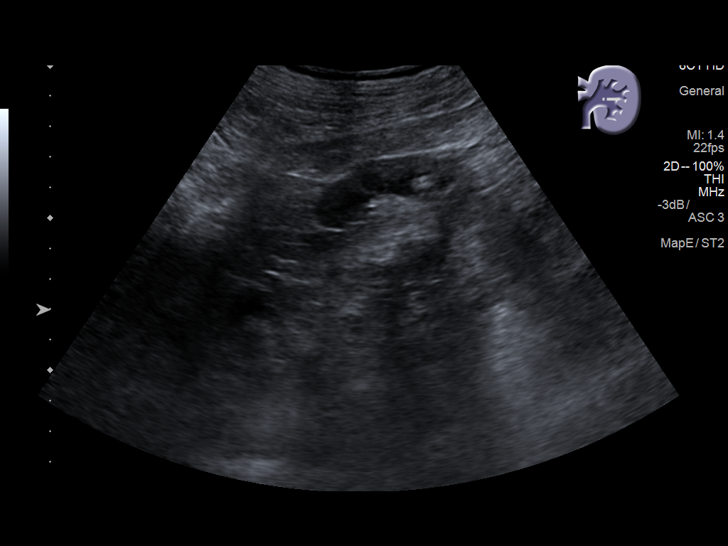
[im 32/39]
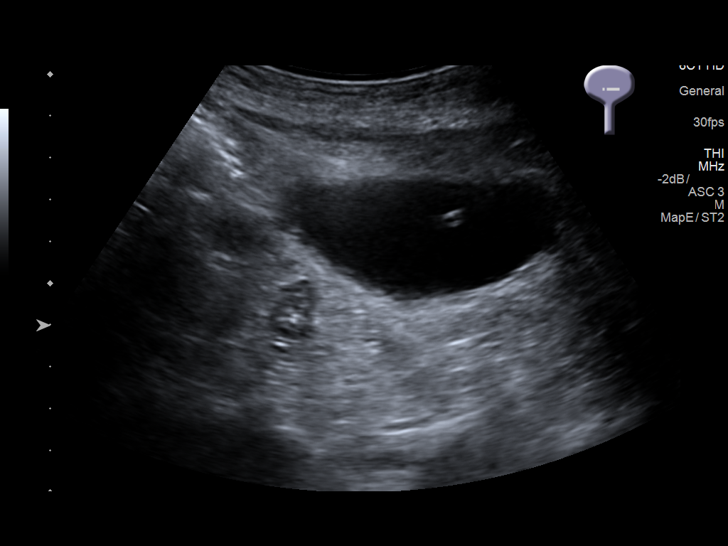
[im 35/39]
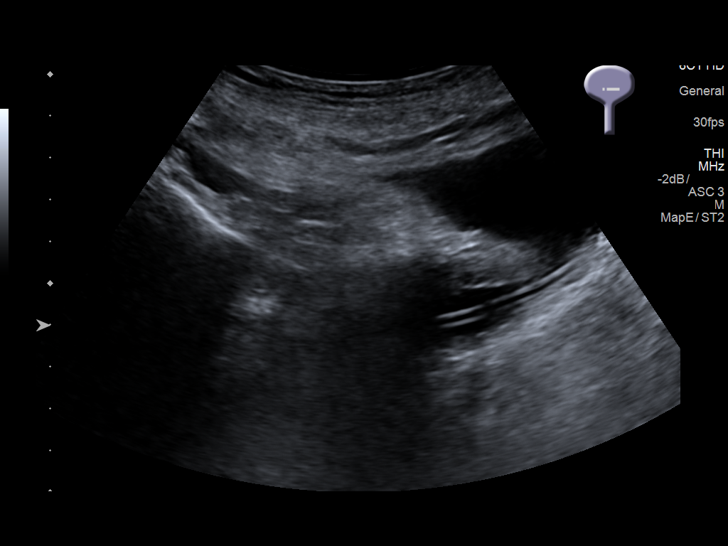
[im 39/39]
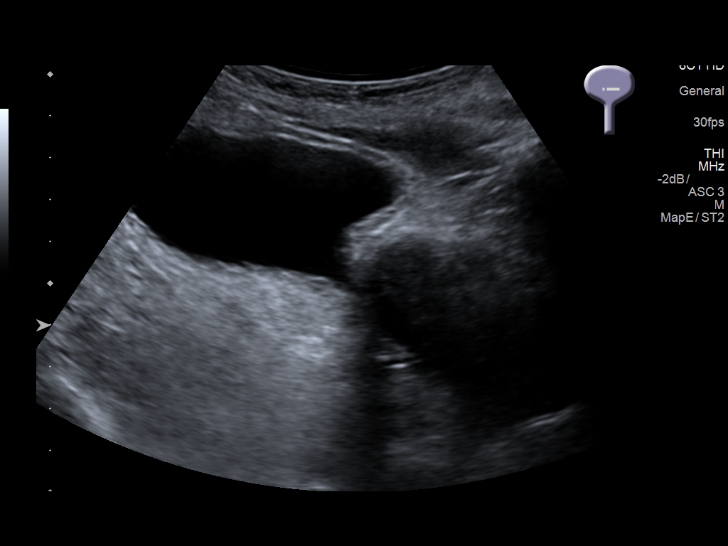

[13 of 25 positions shown; findings below may reference images not displayed]

FINDINGS: Right Kidney:

Length: 11.2 cm. Atrophic echogenic right renal parenchyma. Mild
hydronephrosis, significantly improved since 04/07/2018 CT. No renal
mass.

Left Kidney:

Length: 10.4 cm. Echogenic left renal parenchyma, which is normal in
thickness. No hydronephrosis. Exophytic simple 1.3 cm interpolar
renal cyst.

Bladder:

Chronic mild diffuse bladder wall thickening and trabeculation. Tip
right nephroureteral stent is seen in the bladder lumen. Persistent
distal right ureterectasis. Enlarged prostate measuring 5.3 x 3.8 x
4.4 cm.
IMPRESSION: 1. Mild right hydronephrosis, significantly improved since
04/07/2018 CT. Distal tip of right nephroureteral stent is seen in
the bladder lumen. Persistent distal right ureterectasis.
2. No left hydronephrosis.
3. Chronic atrophy of the right renal parenchyma. Normal size left
kidney. Kidneys are echogenic, compatible with nonspecific acute on
chronic renal parenchymal disease.
4. Chronic mild diffuse bladder wall thickening and trabeculation,
suggesting chronic bladder outlet obstruction by the enlarged
prostate.
